# Patient Record
Sex: Female | Born: 1990 | Race: Black or African American | Hispanic: No | Smoking: Never smoker
Health system: Southern US, Community
[De-identification: ages and names within clinical notes are randomized; demographics above are authoritative.]

## PROBLEM LIST (undated history)

## (undated) DIAGNOSIS — F32A Depression, unspecified: Secondary | ICD-10-CM

## (undated) DIAGNOSIS — D571 Sickle-cell disease without crisis: Secondary | ICD-10-CM

## (undated) DIAGNOSIS — F4310 Post-traumatic stress disorder, unspecified: Secondary | ICD-10-CM

## (undated) DIAGNOSIS — R569 Unspecified convulsions: Secondary | ICD-10-CM

## (undated) DIAGNOSIS — F319 Bipolar disorder, unspecified: Secondary | ICD-10-CM

## (undated) DIAGNOSIS — F329 Major depressive disorder, single episode, unspecified: Secondary | ICD-10-CM

## (undated) HISTORY — DX: Sickle-cell disease without crisis: D57.1

## (undated) HISTORY — DX: Depression, unspecified: F32.A

---
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
## 2019-06-30 ENCOUNTER — Other Ambulatory Visit: Payer: Self-pay

## 2019-06-30 ENCOUNTER — Emergency Department (HOSPITAL_COMMUNITY)
Admission: EM | Admit: 2019-06-30 | Discharge: 2019-06-30 | Disposition: A | Discharge status: Home / Self Care | Payer: Medicaid - Out of State | Attending: Emergency Medicine | Admitting: Emergency Medicine

## 2019-06-30 ENCOUNTER — Encounter (HOSPITAL_COMMUNITY): Payer: Self-pay

## 2019-06-30 DIAGNOSIS — R569 Unspecified convulsions: Secondary | ICD-10-CM | POA: Diagnosis not present

## 2019-06-30 DIAGNOSIS — Z79899 Other long term (current) drug therapy: Secondary | ICD-10-CM | POA: Diagnosis not present

## 2019-06-30 HISTORY — DX: Unspecified convulsions: R56.9

## 2019-06-30 LAB — RAPID URINE DRUG SCREEN, HOSP PERFORMED
Amphetamines: NOT DETECTED
Barbiturates: NOT DETECTED
Benzodiazepines: NOT DETECTED
Cocaine: NOT DETECTED
Opiates: NOT DETECTED
Tetrahydrocannabinol: NOT DETECTED

## 2019-06-30 LAB — URINALYSIS, ROUTINE W REFLEX MICROSCOPIC
Bilirubin Urine: NEGATIVE
Glucose, UA: NEGATIVE mg/dL
Ketones, ur: NEGATIVE mg/dL
Nitrite: NEGATIVE
Protein, ur: 100 mg/dL — AB
RBC / HPF: >50 RBC/hpf — ABNORMAL HIGH (ref 0–5)
Specific Gravity, Urine: 1.013 (ref 1.005–1.030)
pH: 6 (ref 5.0–8.0)

## 2019-06-30 LAB — COMPREHENSIVE METABOLIC PANEL
ALT: 24 U/L (ref 0–44)
AST: 22 U/L (ref 15–41)
Albumin: 4.1 g/dL (ref 3.5–5.0)
Alkaline Phosphatase: 64 U/L (ref 38–126)
Anion gap: 8 (ref 5–15)
BUN: 10 mg/dL (ref 6–20)
CO2: 25 mmol/L (ref 22–32)
Calcium: 9.5 mg/dL (ref 8.9–10.3)
Chloride: 104 mmol/L (ref 98–111)
Creatinine, Ser: 0.85 mg/dL (ref 0.44–1.00)
GFR calc Af Amer: >60 mL/min (ref >60)
GFR calc non Af Amer: >60 mL/min (ref >60)
Glucose, Bld: 100 mg/dL — ABNORMAL HIGH (ref 70–99)
Potassium: 4.4 mmol/L (ref 3.5–5.1)
Sodium: 137 mmol/L (ref 135–145)
Total Bilirubin: 0.3 mg/dL (ref 0.3–1.2)
Total Protein: 8.3 g/dL — ABNORMAL HIGH (ref 6.5–8.1)

## 2019-06-30 LAB — CBC WITH DIFFERENTIAL/PLATELET
Abs Immature Granulocytes: 0.02 10*3/uL (ref 0.00–0.07)
Basophils Absolute: 0 10*3/uL (ref 0.0–0.1)
Basophils Relative: 1 %
Eosinophils Absolute: 0.1 10*3/uL (ref 0.0–0.5)
Eosinophils Relative: 2 %
HCT: 33.2 % — ABNORMAL LOW (ref 36.0–46.0)
Hemoglobin: 10.6 g/dL — ABNORMAL LOW (ref 12.0–15.0)
Immature Granulocytes: 0 %
Lymphocytes Relative: 22 %
Lymphs Abs: 1.3 10*3/uL (ref 0.7–4.0)
MCH: 25.2 pg — ABNORMAL LOW (ref 26.0–34.0)
MCHC: 31.9 g/dL (ref 30.0–36.0)
MCV: 78.9 fL — ABNORMAL LOW (ref 80.0–100.0)
Monocytes Absolute: 0.3 10*3/uL (ref 0.1–1.0)
Monocytes Relative: 5 %
Neutro Abs: 4.2 10*3/uL (ref 1.7–7.7)
Neutrophils Relative %: 70 %
Platelets: 413 10*3/uL — ABNORMAL HIGH (ref 150–400)
RBC: 4.21 MIL/uL (ref 3.87–5.11)
RDW: 14.8 % (ref 11.5–15.5)
WBC: 6 10*3/uL (ref 4.0–10.5)
nRBC: 0 % (ref 0.0–0.2)

## 2019-06-30 LAB — CBG MONITORING, ED: Glucose-Capillary: 80 mg/dL (ref 70–99)

## 2019-06-30 LAB — MAGNESIUM: Magnesium: 2.2 mg/dL (ref 1.7–2.4)

## 2019-06-30 LAB — PHOSPHORUS: Phosphorus: 3.1 mg/dL (ref 2.5–4.6)

## 2019-06-30 LAB — ETHANOL: Alcohol, Ethyl (B): <10 mg/dL (ref <10)

## 2019-06-30 LAB — POC URINE PREG, ED: Preg Test, Ur: NEGATIVE

## 2019-06-30 MED ORDER — LEVETIRACETAM 500 MG PO TABS: 500.0000 mg | ORAL_TABLET | Freq: Two times a day (BID) | ORAL | 0 refills | Status: DC

## 2019-06-30 MED ORDER — LEVETIRACETAM IN NACL 1000 MG/100ML IV SOLN
1000.0000 mg | Freq: Once | INTRAVENOUS | Status: AC
  Administered 2019-06-30: 21:00:00 1000 mg via INTRAVENOUS
  Filled 2019-06-30: qty 100

## 2019-06-30 NOTE — ED Provider Notes (Signed)
[facility] EMERGENCY DEPARTMENT Provider Note   CSN: [id number] Arrival date & time: 06/30/19  2004     History   Chief Complaint Chief Complaint  Patient presents with  . Seizures    HPI Jessica Hobbs is a 28 y.o. female.  Who presents emergency department after seizure.  The patient has a history of epilepsy.  Her last seizure was on 17 June 2019.  She recently moved here from [place] and states that her medications got lost during the move.  She normally takes Keppra and Lamictal.  She has not taken her medications for several weeks.  She is unaware when she is about to have a seizure.  She did have some tongue biting today.  Patient had 2 witnessed seizures tonight.  According the patient she gave birth Apr [birth year], 2019 not 2 weeks ago.  She denies fevers, chills.  She denies drug drug ingestion or alcohol abuse.     HPI  Past Medical History:  Diagnosis Date  . Seizures ([place])     There are no active problems to display for this patient.   History reviewed. No pertinent surgical history.   OB History   No obstetric history on file.      Home Medications    Prior to Admission medications   Not on File    Family History No family history on file.  Social History Social History   Tobacco Use  . Smoking status: Never Smoker  . Smokeless tobacco: Never Used  Substance Use Topics  . Alcohol use: Never    Frequency: Never  . Drug use: Never     Allergies   Soy allergy   Review of Systems Review of Systems Ten systems reviewed and are negative for acute change, except as noted in the HPI.    Physical Exam Updated Vital Signs BP 128/66 (BP Location: Right Arm)   Pulse (!) 102   Temp 98.8 F (37.1 C) (Oral)   Resp 15   Ht 5\' 5"  (1.651 m)   Wt 114.8 kg   SpO2 97%   BMI 42.10 kg/m   Physical Exam Vitals signs and nursing note reviewed.  Constitutional:      General: She is not in acute distress.    Appearance: She is well-developed.  She is not diaphoretic.  HENT:     Head: Normocephalic and atraumatic.     Mouth/Throat:     Comments: Small bite marks to both sides of the tongue Eyes:     General: No scleral icterus.    Conjunctiva/sclera: Conjunctivae normal.  Neck:     Musculoskeletal: Normal range of motion.  Cardiovascular:     Rate and Rhythm: Normal rate and regular rhythm.     Heart sounds: Normal heart sounds. No murmur. No friction rub. No gallop.   Pulmonary:     Effort: Pulmonary effort is normal. No respiratory distress.     Breath sounds: Normal breath sounds.  Abdominal:     General: Bowel sounds are normal. There is no distension.     Palpations: Abdomen is soft. There is no mass.     Tenderness: There is no abdominal tenderness. There is no guarding.  Skin:    General: Skin is warm and dry.  Neurological:     Mental Status: She is alert and oriented to person, place, and time.  Psychiatric:        Behavior: Behavior normal.      ED Treatments / Results  Labs (  all labs ordered are listed, but only abnormal results are displayed) Labs Reviewed  CBC WITH DIFFERENTIAL/PLATELET - Abnormal; Notable for the following components:      Result Value   Hemoglobin 10.6 (*)    HCT 33.2 (*)    MCV 78.9 (*)    MCH 25.2 (*)    Platelets 413 (*)    All other components within normal limits  COMPREHENSIVE METABOLIC PANEL - Abnormal; Notable for the following components:   Glucose, Bld 100 (*)    Total Protein 8.3 (*)    All other components within normal limits  MAGNESIUM  PHOSPHORUS  ETHANOL  RAPID URINE DRUG SCREEN, HOSP PERFORMED  URINALYSIS, ROUTINE W REFLEX MICROSCOPIC  CBG MONITORING, ED  POC URINE PREG, ED    EKG None  Radiology No results found.  Procedures Procedures (including critical care time)  Medications Ordered in ED Medications - No data to display   Initial Impression / Assessment and Plan / ED Course  I have reviewed the triage vital signs and the nursing  notes.  Pertinent labs & imaging results that were available during my care of the patient were reviewed by me and considered in my medical decision making (see chart for details).       28 year old female with history of epilepsy.  She has not been taking her seizure medications.  She had 2 witnessed seizures today.  The patient has not had a seizure here in the emergency department.  I personally reviewed the patient's labs which shows negative pregnancy test, negative UDS, normal blood sugar.  She has some microcytic anemia.  I do not have comparative labs but I doubt significant blood loss.  She has stable vital signs.  I reviewed the patient's CMP which shows mildly elevated blood glucose, normal mag, 5 phosphorus and ethanol levels.  Patient's urine appears contaminated.  She denies any current urinary symptoms.   EKG Interpretation  Date/Time:  Thursday June 30 2019 20:10:20 EDT Ventricular Rate:  100 PR Interval:    QRS Duration: 102 QT Interval:  361 QTC Calculation: 466 R Axis:   67 Text Interpretation:  Sinus tachycardia no prior available for comparison Confirmed by Tilden Fossaees, Elizabeth [phone]([id number]) on 07/01/2019 11:07:16 AM       Patient was given a loading dose of Keppra here.  She be placed back on Keppra 500 twice daily.  She should follow-up with her neurologist in [place] tomorrow to have them re-prescribe her medications.  She appears appropriate for discharge at this time discussed return precautions.  Final Clinical Impressions(s) / ED Diagnoses   Final diagnoses:  None    ED Discharge Orders    None       Arthor CaptainHarris, Nachum Derossett, PA-C 07/02/19 1327    Bethann BerkshireZammit, Joseph, MD 07/05/19 [phone]

## 2019-06-30 NOTE — ED Triage Notes (Signed)
Pt gets medical care in [place], states had 2 witnessed seizures tonight.  Pt is 2 weeks post-partum.  Pt denies any issues during pregnancy.  Pt states she takes lamictal and another seizure med but does not take it like she is supposed to.  Pt denies pain other than to where she bit her tongue. Pt is awake and alert.

## 2019-06-30 NOTE — Discharge Instructions (Signed)
You need to follow closely with your neurologist in [place].  Please call them to get refills on your medication that you lost during your move.  I am giving you a prescription for the standard dose of Keppra however I am unable to fill your Lamictal prescription because I do not know what you normally take.  Please avoid any seizure threshold lowering medications like tramadol and avoid alcohol.  Make sure you are getting plenty of sleep.  Department of Motor Vehicle [facility]) of [place] regulations for seizures - It is the patients responsibility to report the incidence of the seizure in the state of [place]. [place] has no statutory provision requiring physicians to report patients diagnosed with epilepsy or seizures to a central state agency.  The recommended DMV regulation requirement for a driver in [place] for an individual with a seizure is that they be seizure-free for 6-12 months. However, the DMV may consider the following exceptions to this general rule where: (1) a physician-directed change in medication causes a seizure and the individual immediately resumes the previous therapy which controlled seizures; (2) there is a history of nocturnal seizures or seizures which do not involve loss of consciousness, loss of control of motor function, or loss of appropriate sensation and information process; and (3) an individual has a seizure disorder preceded by an aura (warning) lasting 2-3 minutes. While the [facility] may also give consideration to other unusual circumstances which may affect the general requirement that drivers be seizure-free for 6-12 months, interpretation of these circumstances and assignment of restrictions is at the discretion of the Medical Advisor. The DMV also considers compliance with medical therapy essential for safe driving. [facility] Physician's Guide to [employer] (June, 1995 ed.)] The Department learns of an individual's condition by inquiring on the application  form or renewal form, a physician's report to the [facility], an accident report or from correspondence from the individual. The person may be required to submit a Medical Report Form either annually or semi-annually.

## 2019-07-29 ENCOUNTER — Emergency Department (HOSPITAL_COMMUNITY)
Admission: EM | Admit: 2019-07-29 | Discharge: 2019-07-29 | Disposition: A | Discharge status: Home / Self Care | Payer: Medicaid - Out of State | Attending: Emergency Medicine | Admitting: Emergency Medicine

## 2019-07-29 ENCOUNTER — Other Ambulatory Visit: Payer: Self-pay

## 2019-07-29 ENCOUNTER — Encounter (HOSPITAL_COMMUNITY): Payer: Self-pay | Admitting: Emergency Medicine

## 2019-07-29 DIAGNOSIS — Z79899 Other long term (current) drug therapy: Secondary | ICD-10-CM | POA: Diagnosis not present

## 2019-07-29 DIAGNOSIS — R569 Unspecified convulsions: Secondary | ICD-10-CM | POA: Diagnosis present

## 2019-07-29 LAB — CBC WITH DIFFERENTIAL/PLATELET
Abs Immature Granulocytes: 0.03 10*3/uL (ref 0.00–0.07)
Basophils Absolute: 0.1 10*3/uL (ref 0.0–0.1)
Basophils Relative: 1 %
Eosinophils Absolute: 0.1 10*3/uL (ref 0.0–0.5)
Eosinophils Relative: 1 %
HCT: 32.5 % — ABNORMAL LOW (ref 36.0–46.0)
Hemoglobin: 10.3 g/dL — ABNORMAL LOW (ref 12.0–15.0)
Immature Granulocytes: 0 %
Lymphocytes Relative: 23 %
Lymphs Abs: 1.8 10*3/uL (ref 0.7–4.0)
MCH: 25.1 pg — ABNORMAL LOW (ref 26.0–34.0)
MCHC: 31.7 g/dL (ref 30.0–36.0)
MCV: 79.3 fL — ABNORMAL LOW (ref 80.0–100.0)
Monocytes Absolute: 0.4 10*3/uL (ref 0.1–1.0)
Monocytes Relative: 5 %
Neutro Abs: 5.5 10*3/uL (ref 1.7–7.7)
Neutrophils Relative %: 70 %
Platelets: 414 10*3/uL — ABNORMAL HIGH (ref 150–400)
RBC: 4.1 MIL/uL (ref 3.87–5.11)
RDW: 15 % (ref 11.5–15.5)
WBC: 7.9 10*3/uL (ref 4.0–10.5)
nRBC: 0 % (ref 0.0–0.2)

## 2019-07-29 LAB — COMPREHENSIVE METABOLIC PANEL
ALT: 20 U/L (ref 0–44)
AST: 22 U/L (ref 15–41)
Albumin: 3.7 g/dL (ref 3.5–5.0)
Alkaline Phosphatase: 54 U/L (ref 38–126)
Anion gap: 11 (ref 5–15)
BUN: 11 mg/dL (ref 6–20)
CO2: 21 mmol/L — ABNORMAL LOW (ref 22–32)
Calcium: 9.1 mg/dL (ref 8.9–10.3)
Chloride: 104 mmol/L (ref 98–111)
Creatinine, Ser: 0.88 mg/dL (ref 0.44–1.00)
GFR calc Af Amer: >60 mL/min (ref >60)
GFR calc non Af Amer: >60 mL/min (ref >60)
Glucose, Bld: 118 mg/dL — ABNORMAL HIGH (ref 70–99)
Potassium: 3.4 mmol/L — ABNORMAL LOW (ref 3.5–5.1)
Sodium: 136 mmol/L (ref 135–145)
Total Bilirubin: 0.2 mg/dL — ABNORMAL LOW (ref 0.3–1.2)
Total Protein: 7.7 g/dL (ref 6.5–8.1)

## 2019-07-29 LAB — URINALYSIS, ROUTINE W REFLEX MICROSCOPIC
Bacteria, UA: NONE SEEN
Bilirubin Urine: NEGATIVE
Glucose, UA: NEGATIVE mg/dL
Ketones, ur: NEGATIVE mg/dL
Nitrite: NEGATIVE
Protein, ur: NEGATIVE mg/dL
Specific Gravity, Urine: 1.005 (ref 1.005–1.030)
pH: 5 (ref 5.0–8.0)

## 2019-07-29 LAB — MAGNESIUM: Magnesium: 2 mg/dL (ref 1.7–2.4)

## 2019-07-29 LAB — CBG MONITORING, ED: Glucose-Capillary: 96 mg/dL (ref 70–99)

## 2019-07-29 LAB — PREGNANCY, URINE: Preg Test, Ur: NEGATIVE

## 2019-07-29 MED ORDER — LEVETIRACETAM IN NACL 1000 MG/100ML IV SOLN
1000.0000 mg | Freq: Once | INTRAVENOUS | Status: AC
  Administered 2019-07-29: 1000 mg via INTRAVENOUS
  Filled 2019-07-29: qty 100

## 2019-07-29 MED ORDER — LAMOTRIGINE 25 MG PO TABS
100.0000 mg | ORAL_TABLET | Freq: Once | ORAL | Status: AC
  Administered 2019-07-29: 100 mg via ORAL
  Filled 2019-07-29: qty 4

## 2019-07-29 MED ORDER — LEVETIRACETAM 500 MG PO TABS: 500.0000 mg | ORAL_TABLET | Freq: Two times a day (BID) | ORAL | 0 refills | Status: DC

## 2019-07-29 MED ORDER — LAMOTRIGINE 100 MG PO TABS: ORAL_TABLET | ORAL | 0 refills | Status: DC

## 2019-07-29 NOTE — ED Triage Notes (Signed)
Per EMS patient had witnessed seizure at home. Patient postictal upon EMS arrival. Patient alert x 4 and ambulatory in triage. Patient states she does not take her anit-seizure medication regularly.

## 2019-07-29 NOTE — ED Provider Notes (Signed)
[facility] EMERGENCY DEPARTMENT Provider Note   CSN: [id number] Arrival date & time: 07/29/19  0840     History   Chief Complaint Chief Complaint  Patient presents with  . Seizures    HPI Jessica Hobbs is a 28 y.o. female.     Patient is a 28 year old female who presents to the emergency department by EMS following a witnessed seizure at home.  The patient states that she has a history of seizures.  She has been prescribed medication.  She says however that she misses doses from time to time.  She did take a dose of her Lamictal as well as her Keppra on yesterday, but she says she probably missed 2 or 3 days of taking the medication prior to that.  Patient denies any recent fever, cough, chills, or known infection.  She has not had any recent injury or trauma to the head or neck.  She is not had any changes in medication, she is only been inconsistent with taking her medications.  Patient does not remember the seizure, but does remember the EMS personnel taking her to the emergency department.  The history is provided by the patient.  Seizures   Past Medical History:  Diagnosis Date  . Seizures ([facility])     There are no active problems to display for this patient.   Past Surgical History:  Procedure Laterality Date  . CESAREAN SECTION       OB History   No obstetric history on file.      Home Medications    Prior to Admission medications   Medication Sig Start Date End Date Taking? Authorizing Provider  levETIRAcetam (KEPPRA) 500 MG tablet Take 1 tablet (500 mg total) by mouth 2 (two) times daily. 06/30/19   Arthor CaptainHarris, Abigail, PA-C    Family History No family history on file.  Social History Social History   Tobacco Use  . Smoking status: Never Smoker  . Smokeless tobacco: Never Used  Substance Use Topics  . Alcohol use: Never    Frequency: Never  . Drug use: Never     Allergies   Soy allergy   Review of Systems Review of Systems  Constitutional:  Negative for activity change and appetite change.  HENT: Negative for congestion, ear discharge, ear pain, facial swelling, nosebleeds, rhinorrhea, sneezing and tinnitus.   Eyes: Negative for photophobia, pain and discharge.  Respiratory: Negative for cough, choking, shortness of breath and wheezing.   Cardiovascular: Negative for chest pain, palpitations and leg swelling.  Gastrointestinal: Negative for abdominal pain, blood in stool, constipation, diarrhea, nausea and vomiting.  Genitourinary: Negative for difficulty urinating, dysuria, flank pain, frequency and hematuria.  Musculoskeletal: Negative for back pain, gait problem, myalgias and neck pain.  Skin: Negative for color change, rash and wound.  Neurological: Positive for seizures. Negative for dizziness, syncope, facial asymmetry, speech difficulty, weakness and numbness.  Hematological: Negative for adenopathy. Does not bruise/bleed easily.  Psychiatric/Behavioral: Negative for agitation, confusion, hallucinations, self-injury and suicidal ideas. The patient is not nervous/anxious.      Physical Exam Updated Vital Signs BP 137/83 (BP Location: Left Arm)   Pulse (!) 104   Temp 98.6 F (37 C) (Oral)   Resp 18   Ht 5\' 5"  (1.651 m)   Wt 108.9 kg   LMP 07/24/2019 (Approximate)   SpO2 97%   BMI 39.94 kg/m   Physical Exam Vitals signs and nursing note reviewed. Exam conducted with a chaperone present.  Constitutional:  General: She is not in acute distress.    Appearance: She is well-developed.  HENT:     Head: Normocephalic and atraumatic.     Comments: No noted bruising or trauma of the face and head.    Right Ear: External ear normal.     Left Ear: External ear normal.     Mouth/Throat:     Comments: There are bites to the right and left side of the tongue. Shallow abrasion of the mucosa of the lower lip. Eyes:     General: No scleral icterus.       Right eye: No discharge.        Left eye: No discharge.      Conjunctiva/sclera: Conjunctivae normal.  Neck:     Musculoskeletal: Neck supple.     Trachea: No tracheal deviation.  Cardiovascular:     Rate and Rhythm: Normal rate and regular rhythm.  Pulmonary:     Effort: Pulmonary effort is normal. No respiratory distress.     Breath sounds: Normal breath sounds. No stridor. No wheezing or rales.  Chest:     Comments: No pain of the chest or ribs.  Chaperone present during the examination.  There is symmetrical rise and fall of the chest. Abdominal:     General: Bowel sounds are normal. There is no distension.     Palpations: Abdomen is soft.     Tenderness: There is no abdominal tenderness. There is no guarding or rebound.  Musculoskeletal:        General: No tenderness.     Comments: There is full range of motion of upper and lower extremities.  No noted bruising appreciated.  Skin:    General: Skin is warm and dry.     Findings: No rash.  Neurological:     Mental Status: She is alert and oriented to person, place, and time.     Cranial Nerves: No cranial nerve deficit (no facial droop, extraocular movements intact, no slurred speech).     Sensory: No sensory deficit.     Motor: No abnormal muscle tone or seizure activity.     Coordination: Coordination normal.  Psychiatric:        Mood and Affect: Mood normal.      ED Treatments / Results  Labs (all labs ordered are listed, but only abnormal results are displayed) Labs Reviewed  COMPREHENSIVE METABOLIC PANEL  CBC WITH DIFFERENTIAL/PLATELET  PREGNANCY, URINE  URINALYSIS, ROUTINE W REFLEX MICROSCOPIC  MAGNESIUM  LEVETIRACETAM LEVEL  LAMOTRIGINE LEVEL  CBG MONITORING, ED    EKG EKG Interpretation  Date/Time:  Friday July 29 2019 08:46:30 EDT Ventricular Rate:  106 PR Interval:    QRS Duration: 107 QT Interval:  355 QTC Calculation: 472 R Axis:   63 Text Interpretation:  Sinus tachycardia Borderline Q waves in inferior leads When compared with ECG of 06/30/2019 No  significant change was found Confirmed by Samuel Jester [phone]) on 07/29/2019 9:18:34 AM   Radiology No results found.  Procedures Procedures (including critical care time)  Medications Ordered in ED Medications  levETIRAcetam (KEPPRA) IVPB 1000 mg/100 mL premix (has no administration in time range)     Initial Impression / Assessment and Plan / ED Course  I have reviewed the triage vital signs and the nursing notes.  Pertinent labs & imaging results that were available during my care of the patient were reviewed by me and considered in my medical decision making (see chart for details).  Final Clinical Impressions(s) / ED Diagnoses MDM  Vital signs reviewed.  Pulse oximetry is 96% on room air.  Within normal limits by my interpretation.  Capillary blood glucose is 96.  There is no evidence of major bruising or trauma as result of the seizure.  Patient started with IV Keppra.  Patient is unsure of how much of the Lamictal that she takes.  Recheck: no seizure activity reported or noted at this time.  Recheck reveals no acute neurovascular changes.  Recheck.  Patient tolerating IV Keppra without problem.  No additional seizure activity.  Patient sleeping, but easily aroused.  Patient ambulatory to the bathroom without problem.  Comprehensive metabolic panel shows a potassium be slightly low at 3.4, the CO2 slightly low at 21.  The anion gap is normal at 11.  The complete blood count shows the hemoglobin to be slightly low at 10.3, the hematocrit slightly low at 32.5.  Platelets are elevated at 414,000.  Magnesium is normal at 2.0. Urine analysis shows a hazy specimen with a specific gravity of 1.005.  There is a large amount of blood on the dipstick.  The nitrates are negative.  There is a trace leukocyte esterase present.  0-5 red blood cells, 0-5 white blood cells, and no bacteria seen.  A Keppra level and Lamictal level were sent to the lab.  Patient is awake  and alert.  She is ambulatory without problem.  There is been no recurrence of seizure activity.  Prescription for Keppra and Lamictal given to the patient.  Patient given information on the [facility] clinic to assist the patient in obtaining a primary physician and someone to help follow her medications and monitor her seizure activity.  Patient is in agreement with this plan.   Final diagnoses:  Seizure [facility])    ED Discharge Orders         Ordered    lamoTRIgine (LAMICTAL) 100 MG tablet     07/29/19 1248    levETIRAcetam (KEPPRA) 500 MG tablet  2 times daily     07/29/19 1248           Lily Kocher, PA-C 07/30/19 2031    Francine Graven, DO 08/01/19 1718

## 2019-07-29 NOTE — Discharge Instructions (Addendum)
Your lab work is negative for any acute abnormalities.  Your urine test is negative for urinary tract infection.  You have been given IV Keppra to increase your level and to hopefully decrease your seizure threshold.  You have also been given oral Lamictal.  A prescription for both Keppra and Lamictal have been provided on.  It is extremely important that you take your medications regularly and on time to prevent further seizure activity and other acute problems.  Please call the [facility] clinic to establish a primary physician in this area, and help you with control of your seizures.

## 2019-08-02 LAB — LEVETIRACETAM LEVEL: Levetiracetam Lvl: 17.3 ug/mL (ref 10.0–40.0)

## 2019-08-02 LAB — LAMOTRIGINE LEVEL: Lamotrigine Lvl: <1 ug/mL — ABNORMAL LOW (ref 2.0–20.0)

## 2019-09-05 ENCOUNTER — Emergency Department (HOSPITAL_COMMUNITY)
Admission: EM | Admit: 2019-09-05 | Discharge: 2019-09-06 | Disposition: A | Discharge status: Home / Self Care | Payer: Medicaid - Out of State | Attending: Emergency Medicine | Admitting: Emergency Medicine

## 2019-09-05 ENCOUNTER — Encounter (HOSPITAL_COMMUNITY): Payer: Self-pay | Admitting: *Deleted

## 2019-09-05 ENCOUNTER — Other Ambulatory Visit: Payer: Self-pay

## 2019-09-05 DIAGNOSIS — R569 Unspecified convulsions: Secondary | ICD-10-CM | POA: Diagnosis present

## 2019-09-05 DIAGNOSIS — Z79899 Other long term (current) drug therapy: Secondary | ICD-10-CM | POA: Diagnosis not present

## 2019-09-05 DIAGNOSIS — G40909 Epilepsy, unspecified, not intractable, without status epilepticus: Secondary | ICD-10-CM | POA: Insufficient documentation

## 2019-09-05 HISTORY — DX: Post-traumatic stress disorder, unspecified: F43.10

## 2019-09-05 HISTORY — DX: Bipolar disorder, unspecified: F31.9

## 2019-09-05 LAB — COMPREHENSIVE METABOLIC PANEL
ALT: 19 U/L (ref 0–44)
AST: 18 U/L (ref 15–41)
Albumin: 3.9 g/dL (ref 3.5–5.0)
Alkaline Phosphatase: 57 U/L (ref 38–126)
Anion gap: 10 (ref 5–15)
BUN: 10 mg/dL (ref 6–20)
CO2: 24 mmol/L (ref 22–32)
Calcium: 9.3 mg/dL (ref 8.9–10.3)
Chloride: 103 mmol/L (ref 98–111)
Creatinine, Ser: 1.12 mg/dL — ABNORMAL HIGH (ref 0.44–1.00)
GFR calc Af Amer: >60 mL/min (ref >60)
GFR calc non Af Amer: >60 mL/min (ref >60)
Glucose, Bld: 115 mg/dL — ABNORMAL HIGH (ref 70–99)
Potassium: 4.3 mmol/L (ref 3.5–5.1)
Sodium: 137 mmol/L (ref 135–145)
Total Bilirubin: 0.1 mg/dL — ABNORMAL LOW (ref 0.3–1.2)
Total Protein: 8 g/dL (ref 6.5–8.1)

## 2019-09-05 LAB — CBC WITH DIFFERENTIAL/PLATELET
Abs Immature Granulocytes: 0.02 10*3/uL (ref 0.00–0.07)
Basophils Absolute: 0 10*3/uL (ref 0.0–0.1)
Basophils Relative: 0 %
Eosinophils Absolute: 0 10*3/uL (ref 0.0–0.5)
Eosinophils Relative: 0 %
HCT: 34.4 % — ABNORMAL LOW (ref 36.0–46.0)
Hemoglobin: 11 g/dL — ABNORMAL LOW (ref 12.0–15.0)
Immature Granulocytes: 0 %
Lymphocytes Relative: 18 %
Lymphs Abs: 1.4 10*3/uL (ref 0.7–4.0)
MCH: 25.4 pg — ABNORMAL LOW (ref 26.0–34.0)
MCHC: 32 g/dL (ref 30.0–36.0)
MCV: 79.4 fL — ABNORMAL LOW (ref 80.0–100.0)
Monocytes Absolute: 0.5 10*3/uL (ref 0.1–1.0)
Monocytes Relative: 6 %
Neutro Abs: 5.7 10*3/uL (ref 1.7–7.7)
Neutrophils Relative %: 76 %
Platelets: 380 10*3/uL (ref 150–400)
RBC: 4.33 MIL/uL (ref 3.87–5.11)
RDW: 15 % (ref 11.5–15.5)
WBC: 7.6 10*3/uL (ref 4.0–10.5)
nRBC: 0 % (ref 0.0–0.2)

## 2019-09-05 LAB — I-STAT BETA HCG BLOOD, ED (MC, WL, AP ONLY): I-stat hCG, quantitative: <5 m[IU]/mL (ref <5)

## 2019-09-05 MED ORDER — LEVETIRACETAM IN NACL 1000 MG/100ML IV SOLN
1000.0000 mg | Freq: Once | INTRAVENOUS | Status: AC
  Administered 2019-09-05: 23:00:00 1000 mg via INTRAVENOUS
  Filled 2019-09-05: qty 100

## 2019-09-05 MED ORDER — SODIUM CHLORIDE 0.9 % IV BOLUS
1000.0000 mL | Freq: Once | INTRAVENOUS | Status: AC
  Administered 2019-09-05: 23:00:00 1000 mL via INTRAVENOUS

## 2019-09-05 NOTE — ED Provider Notes (Signed)
[facility] EMERGENCY DEPARTMENT Provider Note   CSN: [id number] Arrival date & time: 09/05/19  2211     History   Chief Complaint Chief Complaint  Patient presents with  . Seizures    HPI Jessica Hobbs is a 28 y.o. female.     Patient here after witnessed seizure activity.  History of seizures for which she takes Keppra as well as Lamictal.  EMS reports with witnessed seizure that was tonic-clonic at her aunt's house.  She arrived postictal but is now awake and alert.  There is no known head trauma.  There was some tongue and lip biting.  No incontinence.  No fevers, chills, nausea or vomiting.  No recent illnesses.  No head, neck, back pain.  Patient admits to missing her seizure medications last night.  She states she has been compliant with her medications otherwise of Keppra twice a day Lamictal twice a day.  She has a neurologist in [place].  She reports she is due for MRI later this week.  She admits to missing her medications some days but states she takes them when she remembers but did not take them yesterday.  Denies any possibility of pregnancy.  The history is provided by the patient and the EMS personnel.  Seizures   Past Medical History:  Diagnosis Date  . Bipolar 1 disorder ([place])   . PTSD (post-traumatic stress disorder)   . Seizures ([place])     There are no active problems to display for this patient.   Past Surgical History:  Procedure Laterality Date  . CESAREAN SECTION       OB History   No obstetric history on file.      Home Medications    Prior to Admission medications   Medication Sig Start Date End Date Taking? Authorizing Provider  lamoTRIgine (LAMICTAL) 100 MG tablet Take 1 tab each morning, and 2 at hs 07/29/19   Lily Kocher, PA-C  levETIRAcetam (KEPPRA) 500 MG tablet Take 1 tablet (500 mg total) by mouth 2 (two) times daily. 07/29/19   Lily Kocher, PA-C    Family History History reviewed. No pertinent family history.  Social  History Social History   Tobacco Use  . Smoking status: Never Smoker  . Smokeless tobacco: Never Used  Substance Use Topics  . Alcohol use: Never    Frequency: Never  . Drug use: Never     Allergies   Soy allergy   Review of Systems Review of Systems  Constitutional: Negative for activity change, appetite change and fever.  HENT: Negative for congestion and rhinorrhea.   Eyes: Negative for visual disturbance.  Respiratory: Negative for cough, chest tightness and shortness of breath.   Cardiovascular: Negative for chest pain.  Gastrointestinal: Negative for abdominal pain, nausea and vomiting.  Genitourinary: Negative for dysuria and hematuria.  Musculoskeletal: Negative for arthralgias and myalgias.  Skin: Positive for wound.  Neurological: Positive for seizures. Negative for dizziness and headaches.   all other systems are negative except as noted in the HPI and PMH.     Physical Exam Updated Vital Signs BP 119/73 (BP Location: Right Arm)   Pulse (!) 104   Temp 98.3 F (36.8 C) (Oral)   Ht 5\' 5"  (1.651 m)   Wt 108 kg   SpO2 99%   BMI 39.62 kg/m   Physical Exam Vitals signs and nursing note reviewed.  Constitutional:      General: She is not in acute distress.    Appearance: She is well-developed.  She is obese.  HENT:     Head: Normocephalic and atraumatic.     Mouth/Throat:     Pharynx: No oropharyngeal exudate.     Comments: Lower lip abrasion Eyes:     Extraocular Movements: Extraocular movements intact.     Conjunctiva/sclera: Conjunctivae normal.     Pupils: Pupils are equal, round, and reactive to light.     Comments: Dysconjugate gaze, baseline per patient.   Neck:     Musculoskeletal: Normal range of motion and neck supple.     Comments: No meningismus. Cardiovascular:     Rate and Rhythm: Normal rate and regular rhythm.     Heart sounds: Normal heart sounds. No murmur.  Pulmonary:     Effort: Pulmonary effort is normal. No respiratory  distress.     Breath sounds: Normal breath sounds.  Abdominal:     Palpations: Abdomen is soft.     Tenderness: There is no abdominal tenderness. There is no guarding or rebound.  Musculoskeletal: Normal range of motion.        General: No tenderness.  Skin:    General: Skin is warm.     Capillary Refill: Capillary refill takes less than 2 seconds.  Neurological:     General: No focal deficit present.     Mental Status: She is alert and oriented to person, place, and time. Mental status is at baseline.     Cranial Nerves: No cranial nerve deficit.     Motor: No abnormal muscle tone.     Coordination: Coordination normal.     Comments: No ataxia on finger to nose bilaterally. No pronator drift. 5/5 strength throughout. CN 2-12 intact.Equal grip strength. Sensation intact.   Psychiatric:        Behavior: Behavior normal.      ED Treatments / Results  Labs (all labs ordered are listed, but only abnormal results are displayed) Labs Reviewed  CBC WITH DIFFERENTIAL/PLATELET - Abnormal; Notable for the following components:      Result Value   Hemoglobin 11.0 (*)    HCT 34.4 (*)    MCV 79.4 (*)    MCH 25.4 (*)    All other components within normal limits  COMPREHENSIVE METABOLIC PANEL - Abnormal; Notable for the following components:   Glucose, Bld 115 (*)    Creatinine, Ser 1.12 (*)    Total Bilirubin 0.1 (*)    All other components within normal limits  RAPID URINE DRUG SCREEN, HOSP PERFORMED  I-STAT BETA HCG BLOOD, ED ([facility], [facility], [facility] ONLY)    EKG EKG Interpretation  Date/Time:  Monday September 05 2019 22:47:48 EDT Ventricular Rate:  100 PR Interval:    QRS Duration: 109 QT Interval:  368 QTC Calculation: 475 R Axis:   68 Text Interpretation:  Sinus tachycardia RSR' in V1 or V2, right VCD or RVH No significant change was found Confirmed by Glynn Octave [phone]) on 09/05/2019 11:19:19 PM   Radiology Ct Head Wo Contrast  Result Date: 09/06/2019 CLINICAL DATA:   Encephalopathy, witnessed seizure, history of epilepsy EXAM: CT HEAD WITHOUT CONTRAST TECHNIQUE: Contiguous axial images were obtained from the base of the skull through the vertex without intravenous contrast. COMPARISON:  None. FINDINGS: Brain: No evidence of acute infarction, hemorrhage, hydrocephalus, extra-axial collection or mass lesion/mass effect. Morphologically normal configuration of the mesial temporal lobes Vascular: No hyperdense vessel or unexpected calcification. Skull: Small amount of right frontal scalp swelling and trace 2 mm subgaleal hematoma. No subcutaneous gas or foreign body. No  subjacent calvarial fracture. No suspicious osseous lesions. Sinuses/Orbits: Paranasal sinuses and mastoid air cells are predominantly clear. Dysconjugate gaze. Included portions of the orbits are otherwise unremarkable. Other: None IMPRESSION: 1. No acute intracranial abnormality. 2. Dysconjugate gaze, correlate with visual inspection. 3. Small amount of right frontal scalp swelling and trace 2 mm subgaleal hematoma. No subjacent calvarial fracture. Electronically Signed   By: Kreg ShropshirePrice  DeHay M.D.   On: 09/06/2019 02:44    Procedures Procedures (including critical care time)  Medications Ordered in ED Medications  sodium chloride 0.9 % bolus 1,000 mL (has no administration in time range)  levETIRAcetam (KEPPRA) IVPB 1000 mg/100 mL premix (has no administration in time range)     Initial Impression / Assessment and Plan / ED Course  I have reviewed the triage vital signs and the nursing notes.  Pertinent labs & imaging results that were available during my care of the patient were reviewed by me and considered in my medical decision making (see chart for details).       Seizure with known seizure disorder, noncompliant with medications.  She is awake and alert.  No evidence of trauma.  No fever.  Patient be loaded with Keppra.  Given IV fluids.  CT is negative.  Reassuring work-up in the ED with  normal electrolytes.  Patient given loading dose of Keppra.  Admits to missing several doses of her seizure medication.  Refills of seizure medication sent.  Patient has follow-up with her neurologist later this week.  She is reminded that no driving until 6 months seizure-free.  Follow-up with her neurologist.  Return precautions discussed. Final Clinical Impressions(s) / ED Diagnoses   Final diagnoses:  Seizure disorder [facility](HCC)    ED Discharge Orders    None       Glynn Octaveancour, Orpheus [place], MD 09/06/19 [phone]

## 2019-09-05 NOTE — ED Triage Notes (Signed)
Pt brought in by [facility] for c/o seizure; pt had a witnessed seizure; when ems arrived pt was post-ictal; pt is alert at this time; pt is to have brain scan this coming week to follow up with seizures

## 2019-09-06 ENCOUNTER — Emergency Department (HOSPITAL_COMMUNITY): Payer: Medicaid - Out of State

## 2019-09-06 MED ORDER — LAMOTRIGINE 100 MG PO TABS: ORAL_TABLET | ORAL | 0 refills | Status: DC

## 2019-09-06 MED ORDER — LEVETIRACETAM 500 MG PO TABS: 500.0000 mg | ORAL_TABLET | Freq: Two times a day (BID) | ORAL | 0 refills | Status: DC

## 2019-09-06 NOTE — Discharge Instructions (Addendum)
Take your seizure medication as prescribed and follow-up with your neurologist.  Do not drive or operate heavy machinery until you are cleared by your neurologist.  Return to the ED with new or worsening symptoms.

## 2019-09-06 NOTE — ED Notes (Signed)
Ambulated Pt down the hall and back. Pt stated feeling fine: no light headedness or dizziness. HR 115 while walking , o2 100%

## 2019-10-14 ENCOUNTER — Emergency Department (HOSPITAL_COMMUNITY)
Admission: EM | Admit: 2019-10-14 | Discharge: 2019-10-14 | Disposition: A | Discharge status: Home / Self Care | Payer: Medicaid - Out of State | Attending: Emergency Medicine | Admitting: Emergency Medicine

## 2019-10-14 ENCOUNTER — Encounter (HOSPITAL_COMMUNITY): Payer: Self-pay

## 2019-10-14 ENCOUNTER — Other Ambulatory Visit: Payer: Self-pay

## 2019-10-14 DIAGNOSIS — R569 Unspecified convulsions: Secondary | ICD-10-CM | POA: Diagnosis not present

## 2019-10-14 DIAGNOSIS — Z79899 Other long term (current) drug therapy: Secondary | ICD-10-CM | POA: Diagnosis not present

## 2019-10-14 LAB — BASIC METABOLIC PANEL
Anion gap: 10 (ref 5–15)
BUN: 7 mg/dL (ref 6–20)
CO2: 24 mmol/L (ref 22–32)
Calcium: 9.3 mg/dL (ref 8.9–10.3)
Chloride: 104 mmol/L (ref 98–111)
Creatinine, Ser: 0.86 mg/dL (ref 0.44–1.00)
GFR calc Af Amer: >60 mL/min (ref >60)
GFR calc non Af Amer: >60 mL/min (ref >60)
Glucose, Bld: 88 mg/dL (ref 70–99)
Potassium: 3.5 mmol/L (ref 3.5–5.1)
Sodium: 138 mmol/L (ref 135–145)

## 2019-10-14 LAB — CBC WITH DIFFERENTIAL/PLATELET
Abs Immature Granulocytes: 0.04 10*3/uL (ref 0.00–0.07)
Basophils Absolute: 0 10*3/uL (ref 0.0–0.1)
Basophils Relative: 0 %
Eosinophils Absolute: 0 10*3/uL (ref 0.0–0.5)
Eosinophils Relative: 0 %
HCT: 32.6 % — ABNORMAL LOW (ref 36.0–46.0)
Hemoglobin: 10.5 g/dL — ABNORMAL LOW (ref 12.0–15.0)
Immature Granulocytes: 0 %
Lymphocytes Relative: 8 %
Lymphs Abs: 1.1 10*3/uL (ref 0.7–4.0)
MCH: 25.8 pg — ABNORMAL LOW (ref 26.0–34.0)
MCHC: 32.2 g/dL (ref 30.0–36.0)
MCV: 80.1 fL (ref 80.0–100.0)
Monocytes Absolute: 0.7 10*3/uL (ref 0.1–1.0)
Monocytes Relative: 5 %
Neutro Abs: 11.1 10*3/uL — ABNORMAL HIGH (ref 1.7–7.7)
Neutrophils Relative %: 87 %
Platelets: 375 10*3/uL (ref 150–400)
RBC: 4.07 MIL/uL (ref 3.87–5.11)
RDW: 14.8 % (ref 11.5–15.5)
WBC: 12.9 10*3/uL — ABNORMAL HIGH (ref 4.0–10.5)
nRBC: 0 % (ref 0.0–0.2)

## 2019-10-14 MED ORDER — SODIUM CHLORIDE 0.9 % IV BOLUS
1000.0000 mL | Freq: Once | INTRAVENOUS | Status: AC
  Administered 2019-10-14: 1000 mL via INTRAVENOUS

## 2019-10-14 MED ORDER — LEVETIRACETAM IN NACL 1000 MG/100ML IV SOLN
1000.0000 mg | Freq: Once | INTRAVENOUS | Status: AC
  Administered 2019-10-14: 1000 mg via INTRAVENOUS
  Filled 2019-10-14: qty 100

## 2019-10-14 NOTE — Discharge Instructions (Addendum)
It is important for you to take your seizure medication as prescribed by your neurologist.  It is important for you to take your medicine as prescribed to help avoid break through seizures.  If you oversleep it is better to take your morning dose when you wake rather than skipping it altogether.  You might also consider setting your alarm on your phone to wake you for your 10 am dose.  You are reminded not to drive for 6 months after your last seizure.

## 2019-10-14 NOTE — ED Triage Notes (Signed)
Pt here for seizure, known history. Had seizure last night and this am, unsure how Brass it lasted. Pt alert and oriented now.

## 2019-10-15 NOTE — ED Provider Notes (Signed)
[facility] EMERGENCY DEPARTMENT Provider Note   CSN: [id number] Arrival date & time: 10/14/19  1213     History   Chief Complaint Chief Complaint  Patient presents with  . Seizures    HPI Jessica Hobbs is a 28 y.o. female with history of seizure disorder under the care of a neurologist in [place], presenting with seizure x 2, once last night, then again this am.  She is unsure of the length of time of the seizures, she had one in her sleep, known as she woke with soreness in her back and abrasions on the side of her tongue and a swollen lower lip.  This am's seizure occurred in the bathroom and the end of it was witnessed by a family member.  She feels recovered from these occurrences at present.  She does endorse missing doses of her seizure medicines.  Her keppra was increased to 1000 mg bid, her lamictal unchanged since her last visit here for this problem. She states she is supposed to take her medicines at 10 am and 10 pm.  However, she states she sometimes sleeps until noon, so skips her am dose out of concern of taking it too close to her evening dose.  This last occurred yesterday morning.      The history is provided by the patient.    Past Medical History:  Diagnosis Date  . Bipolar 1 disorder (HCC)   . PTSD (post-traumatic stress disorder)   . Seizures ([facility])     There are no active problems to display for this patient.   Past Surgical History:  Procedure Laterality Date  . CESAREAN SECTION       OB History   No obstetric history on file.      Home Medications    Prior to Admission medications   Medication Sig Start Date End Date Taking? Authorizing Provider  lamoTRIgine (LAMICTAL) 100 MG tablet Take 1 tab each morning, and 2 at hs 09/06/19  Yes Rancour, Jeannett Senior, MD  levETIRAcetam (KEPPRA) 500 MG tablet Take 1 tablet (500 mg total) by mouth 2 (two) times daily. 09/06/19  Yes Rancour, Jeannett Senior, MD    Family History No family history on file.  Social  History Social History   Tobacco Use  . Smoking status: Never Smoker  . Smokeless tobacco: Never Used  Substance Use Topics  . Alcohol use: Never    Frequency: Never  . Drug use: Never     Allergies   Soy allergy   Review of Systems Review of Systems  Constitutional: Negative for chills and fever.  HENT: Positive for facial swelling. Negative for sore throat.   Eyes: Negative.   Respiratory: Negative for chest tightness and shortness of breath.   Cardiovascular: Negative for chest pain.  Gastrointestinal: Negative for abdominal pain and nausea.  Genitourinary: Negative.   Musculoskeletal: Positive for back pain. Negative for arthralgias, joint swelling and neck pain.  Skin: Negative.  Negative for rash and wound.  Neurological: Positive for seizures. Negative for dizziness, weakness, light-headedness, numbness and headaches.  Psychiatric/Behavioral: Negative.      Physical Exam Updated Vital Signs BP 127/78   Pulse (!) 103   Temp 98.8 F (37.1 C) (Oral)   Resp (!) 25   Ht 5\' 4"  (1.626 m)   Wt 110.5 kg   LMP 09/24/2019   SpO2 98%   BMI 41.80 kg/m   Physical Exam Vitals signs and nursing note reviewed.  Constitutional:      General: She is  not in acute distress.    Appearance: Normal appearance. She is well-developed.  HENT:     Head: Normocephalic.     Comments: Edematous lower lip with abrasion noted of the mucosa, abrasions bilateral mid tongue, no lacerations.    Mouth/Throat:     Mouth: Mucous membranes are moist.  Eyes:     Extraocular Movements: Extraocular movements intact.     Conjunctiva/sclera: Conjunctivae normal.     Pupils: Pupils are equal, round, and reactive to light.  Neck:     Musculoskeletal: Normal range of motion.  Cardiovascular:     Rate and Rhythm: Normal rate and regular rhythm.     Heart sounds: Normal heart sounds.  Pulmonary:     Effort: Pulmonary effort is normal.     Breath sounds: Normal breath sounds. No wheezing.   Abdominal:     General: Bowel sounds are normal.     Palpations: Abdomen is soft.     Tenderness: There is no abdominal tenderness.  Musculoskeletal: Normal range of motion.  Skin:    General: Skin is warm and dry.  Neurological:     General: No focal deficit present.     Mental Status: She is alert and oriented to person, place, and time.     Cranial Nerves: No cranial nerve deficit.     Sensory: No sensory deficit.     Gait: Gait normal.  Psychiatric:        Mood and Affect: Mood normal.      ED Treatments / Results  Labs (all labs ordered are listed, but only abnormal results are displayed) Labs Reviewed  CBC WITH DIFFERENTIAL/PLATELET - Abnormal; Notable for the following components:      Result Value   WBC 12.9 (*)    Hemoglobin 10.5 (*)    HCT 32.6 (*)    MCH 25.8 (*)    Neutro Abs 11.1 (*)    All other components within normal limits  BASIC METABOLIC PANEL    EKG None  Radiology No results found.  Procedures Procedures (including critical care time)  Medications Ordered in ED Medications  levETIRAcetam (KEPPRA) IVPB 1000 mg/100 mL premix (0 mg Intravenous Stopped 10/14/19 1457)  sodium chloride 0.9 % bolus 1,000 mL (0 mLs Intravenous Stopped 10/14/19 1629)     Initial Impression / Assessment and Plan / ED Course  I have reviewed the triage vital signs and the nursing notes.  Pertinent labs & imaging results that were available during my care of the patient were reviewed by me and considered in my medical decision making (see chart for details).        Pt with no signs/sx at this time of being post ictal, mild tongue and lip abrasions.  She was given a loading dose of keppra given her missed dose yesterday.  She was advised to not miss her am dose, suggesting worse case, take the evening dose later, or setting her alarm for 10 am to make sure she gets the am dose at the ideal intervals.  Advised to f/u with her neurologist asap.  Reminded about not  driving within 6 months of seizure.  Final Clinical Impressions(s) / ED Diagnoses   Final diagnoses:  Seizure [facility])    ED Discharge Orders    None       Landis Martins 10/15/19 0750    Milton Ferguson, MD 10/17/19 [phone]

## 2019-11-16 ENCOUNTER — Encounter (HOSPITAL_COMMUNITY): Payer: Self-pay | Admitting: Emergency Medicine

## 2019-11-16 ENCOUNTER — Other Ambulatory Visit: Payer: Self-pay

## 2019-11-16 ENCOUNTER — Emergency Department (HOSPITAL_COMMUNITY)
Admission: EM | Admit: 2019-11-16 | Discharge: 2019-11-17 | Disposition: A | Discharge status: Home / Self Care | Payer: Medicaid - Out of State | Attending: Emergency Medicine | Admitting: Emergency Medicine

## 2019-11-16 DIAGNOSIS — R438 Other disturbances of smell and taste: Secondary | ICD-10-CM | POA: Diagnosis present

## 2019-11-16 DIAGNOSIS — U071 COVID-19: Secondary | ICD-10-CM

## 2019-11-16 DIAGNOSIS — Z79899 Other long term (current) drug therapy: Secondary | ICD-10-CM | POA: Insufficient documentation

## 2019-11-16 DIAGNOSIS — R197 Diarrhea, unspecified: Secondary | ICD-10-CM | POA: Diagnosis not present

## 2019-11-16 DIAGNOSIS — R0981 Nasal congestion: Secondary | ICD-10-CM | POA: Insufficient documentation

## 2019-11-16 NOTE — ED Triage Notes (Signed)
Pt states she is here for a COVID test, reports she has loss of taste and smell, diarrhea, nasal drainage x 3 days

## 2019-11-17 LAB — POC SARS CORONAVIRUS 2 AG -  ED: SARS Coronavirus 2 Ag: POSITIVE — AB

## 2019-11-17 NOTE — ED Notes (Signed)
Pt seen storming out of room stating "I just had a kid." PA Tammy in the room at time of incident.

## 2019-11-17 NOTE — ED Provider Notes (Signed)
[facility] EMERGENCY DEPARTMENT Provider Note   CSN: [id number] Arrival date & time: 11/16/19  2103     History Chief Complaint  Patient presents with  . possible COVID    Jessica Hobbs is a 28 y.o. female.  HPI      Jessica Hobbs is a 28 y.o. female who presents to the Emergency Department requesting a Covid test.  She reports a loss of taste and smell with nasal congestion and diarrhea.  Symptoms have been present for 2 to 3 days.  She first noticed what she thought was a "cold" with symptoms of a runny nose and nasal congestion.  Gradually worsening loss of smell and taste and several episodes of loose stools.  She denies any chest pain, cough, fever, or shortness of breath.  No known Covid exposures.   Past Medical History:  Diagnosis Date  . Bipolar 1 disorder (H[facility])   . PTSD (post-traumatic stress disorder)   . Seizures ([facility])     There are no problems to display for this patient.   Past Surgical History:  Procedure Laterality Date  . CESAREAN SECTION       OB History   No obstetric history on file.     No family history on file.  Social History   Tobacco Use  . Smoking status: Never Smoker  . Smokeless tobacco: Never Used  Substance Use Topics  . Alcohol use: Never  . Drug use: Never    Home Medications Prior to Admission medications   Medication Sig Start Date End Date Taking? Authorizing Provider  lamoTRIgine (LAMICTAL) 100 MG tablet Take 1 tab each morning, and 2 at hs 09/06/19   Rancour, Jeannett Senior, MD  levETIRAcetam (KEPPRA) 500 MG tablet Take 1 tablet (500 mg total) by mouth 2 (two) times daily. 09/06/19   Rancour, Jeannett Senior, MD    Allergies    Soy allergy  Review of Systems   Review of Systems  Constitutional: Negative for activity change, appetite change, chills and fever.  HENT: Positive for congestion and rhinorrhea. Negative for facial swelling, sore throat and trouble swallowing.   Eyes: Negative for visual disturbance.    Respiratory: Negative for cough, shortness of breath, wheezing and stridor.   Gastrointestinal: Positive for diarrhea. Negative for abdominal pain, nausea and vomiting.  Genitourinary: Negative for difficulty urinating and flank pain.  Musculoskeletal: Negative for neck pain and neck stiffness.  Skin: Negative for rash.  Neurological: Negative for dizziness, weakness, numbness and headaches.  Hematological: Negative for adenopathy.  Psychiatric/Behavioral: Negative for confusion.    Physical Exam Updated Vital Signs BP (!) 148/85 (BP Location: Right Arm)   Pulse 94   Temp 98.3 F (36.8 C) (Oral)   Resp 20   Ht 5\' 5"  (1.651 m)   Wt 108.9 kg   LMP 10/26/2019   SpO2 98%   BMI 39.94 kg/m   Physical Exam Vitals and nursing note reviewed.  Constitutional:      Appearance: Normal appearance. She is not ill-appearing or toxic-appearing.  HENT:     Head: Normocephalic.     Right Ear: Tympanic membrane and ear canal normal.     Left Ear: Tympanic membrane and ear canal normal.     Mouth/Throat:     Mouth: Mucous membranes are moist.  Neck:     Thyroid: No thyromegaly.     Meningeal: Kernig's sign absent.  Cardiovascular:     Rate and Rhythm: Normal rate and regular rhythm.     Pulses: Normal pulses.  Pulmonary:     Effort: Pulmonary effort is normal. No respiratory distress.     Breath sounds: Normal breath sounds. No stridor. No wheezing.  Abdominal:     General: There is no distension.     Palpations: Abdomen is soft.     Tenderness: There is no abdominal tenderness. There is no guarding or rebound.  Musculoskeletal:        General: Normal range of motion.     Cervical back: Normal range of motion. No rigidity.  Lymphadenopathy:     Cervical: No cervical adenopathy.  Skin:    General: Skin is warm.     Findings: No rash.  Neurological:     General: No focal deficit present.     Mental Status: She is alert.     ED Results / Procedures / Treatments   Labs (all  labs ordered are listed, but only abnormal results are displayed) Labs Reviewed  POC SARS CORONAVIRUS 2 AG -  ED - Abnormal; Notable for the following components:      Result Value   SARS Coronavirus 2 Ag POSITIVE (*)    All other components within normal limits    EKG None  Radiology No results found.  Procedures Procedures (including critical care time)  Medications Ordered in ED Medications - No data to display  ED Course  I have reviewed the triage vital signs and the nursing notes.  Pertinent labs & imaging results that were available during my care of the patient were reviewed by me and considered in my medical decision making (see chart for details).    MDM Rules/Calculators/A&P                      Pt here requesting COVID test.  Symptoms for 2-3 days.  She is well appearing, no hypoxia, tachycardia.    I told patient of test results she became very upset, tearful.  Removed her mask and immediately call family member on telephone.  I asked patient to hang up the phone and listen to my recommendations, but she kept repeating "I am going to die, I am going to die" I tried to reassure her, but she got up and stormed out of the dept.     Cllinical Impression(s) / ED Diagnoses Final diagnoses:  COVID-19 virus infection    Rx / DC Orders ED Discharge Orders    None       Kem Parkinson, PA-C 11/17/19 0231    Ripley Fraise, MD 11/17/19 [phone]

## 2019-12-08 DIAGNOSIS — R000 Tachycardia, unspecified: Secondary | ICD-10-CM | POA: Diagnosis not present

## 2019-12-08 DIAGNOSIS — I10 Essential (primary) hypertension: Secondary | ICD-10-CM | POA: Diagnosis not present

## 2019-12-08 DIAGNOSIS — R569 Unspecified convulsions: Secondary | ICD-10-CM | POA: Diagnosis not present

## 2019-12-09 ENCOUNTER — Emergency Department (HOSPITAL_COMMUNITY)
Admission: EM | Admit: 2019-12-09 | Discharge: 2019-12-09 | Disposition: A | Discharge status: Home / Self Care | Payer: Medicaid - Out of State | Attending: Emergency Medicine | Admitting: Emergency Medicine

## 2019-12-09 ENCOUNTER — Encounter (HOSPITAL_COMMUNITY): Payer: Self-pay | Admitting: Emergency Medicine

## 2019-12-09 ENCOUNTER — Other Ambulatory Visit: Payer: Self-pay

## 2019-12-09 DIAGNOSIS — R569 Unspecified convulsions: Secondary | ICD-10-CM | POA: Diagnosis not present

## 2019-12-09 DIAGNOSIS — G40909 Epilepsy, unspecified, not intractable, without status epilepticus: Secondary | ICD-10-CM | POA: Insufficient documentation

## 2019-12-09 DIAGNOSIS — Z79899 Other long term (current) drug therapy: Secondary | ICD-10-CM | POA: Insufficient documentation

## 2019-12-09 DIAGNOSIS — R0902 Hypoxemia: Secondary | ICD-10-CM | POA: Diagnosis not present

## 2019-12-09 LAB — CBC WITH DIFFERENTIAL/PLATELET
Abs Immature Granulocytes: 0.03 10*3/uL (ref 0.00–0.07)
Basophils Absolute: 0 10*3/uL (ref 0.0–0.1)
Basophils Relative: 0 %
Eosinophils Absolute: 0 10*3/uL (ref 0.0–0.5)
Eosinophils Relative: 0 %
HCT: 34.3 % — ABNORMAL LOW (ref 36.0–46.0)
Hemoglobin: 11 g/dL — ABNORMAL LOW (ref 12.0–15.0)
Immature Granulocytes: 0 %
Lymphocytes Relative: 18 %
Lymphs Abs: 2.1 10*3/uL (ref 0.7–4.0)
MCH: 26 pg (ref 26.0–34.0)
MCHC: 32.1 g/dL (ref 30.0–36.0)
MCV: 81.1 fL (ref 80.0–100.0)
Monocytes Absolute: 0.5 10*3/uL (ref 0.1–1.0)
Monocytes Relative: 5 %
Neutro Abs: 9.1 10*3/uL — ABNORMAL HIGH (ref 1.7–7.7)
Neutrophils Relative %: 77 %
Platelets: 355 10*3/uL (ref 150–400)
RBC: 4.23 MIL/uL (ref 3.87–5.11)
RDW: 14.5 % (ref 11.5–15.5)
WBC: 11.8 10*3/uL — ABNORMAL HIGH (ref 4.0–10.5)
nRBC: 0 % (ref 0.0–0.2)

## 2019-12-09 LAB — BASIC METABOLIC PANEL
Anion gap: 11 (ref 5–15)
BUN: 8 mg/dL (ref 6–20)
CO2: 22 mmol/L (ref 22–32)
Calcium: 9.4 mg/dL (ref 8.9–10.3)
Chloride: 105 mmol/L (ref 98–111)
Creatinine, Ser: 0.72 mg/dL (ref 0.44–1.00)
GFR calc Af Amer: >60 mL/min (ref >60)
GFR calc non Af Amer: >60 mL/min (ref >60)
Glucose, Bld: 87 mg/dL (ref 70–99)
Potassium: 3.9 mmol/L (ref 3.5–5.1)
Sodium: 138 mmol/L (ref 135–145)

## 2019-12-09 LAB — HCG, QUANTITATIVE, PREGNANCY: hCG, Beta Chain, Quant, S: 1 m[IU]/mL (ref <5)

## 2019-12-09 MED ORDER — LEVETIRACETAM IN NACL 1000 MG/100ML IV SOLN
1000.0000 mg | Freq: Once | INTRAVENOUS | Status: AC
  Administered 2019-12-09: 1000 mg via INTRAVENOUS
  Filled 2019-12-09: qty 100

## 2019-12-09 MED ORDER — ACETAMINOPHEN 325 MG PO TABS
650.0000 mg | ORAL_TABLET | Freq: Once | ORAL | Status: AC
  Administered 2019-12-09: 19:00:00 650 mg via ORAL
  Filled 2019-12-09: qty 2

## 2019-12-09 NOTE — ED Triage Notes (Signed)
Seizure yesterday and one today and pt refused to come to ED.  Pt on Keppra and dosage.  Pt says she did not take medication last night.  VSS.  Refused CBG with EMS.

## 2019-12-09 NOTE — Discharge Instructions (Addendum)
It is important to make sure you take your seizure medications as prescribed by your neurologist.  Call this provider for a recheck within the next week.  Remember that it is dangerous and illegal to drive within 6 months of having a seizure.

## 2019-12-09 NOTE — ED Notes (Signed)
Seizure pads in place

## 2019-12-09 NOTE — ED Provider Notes (Signed)
[facility] EMERGENCY DEPARTMENT Provider Note   CSN: [id number] Arrival date & time: 12/09/19  1554     History Chief Complaint  Patient presents with  . Seizures    Jessica Hobbs is a 29 y.o. female with a history as outlined below, most significant for seizure disorder on Keppra 500 mg twice daily presenting with 2 seizures one occurring last night and another occurring today.  Patient does not recall either event and denies aura or other sequelae prior to seizures.  She bit her lip during today's seizure, otherwise denies any other injuries.  She has a mild headache and is drowsy at this time, otherwise is without complaints.  According to the aunt who lives with the patient and called EMS today she missed her Keppra dose last night and this morning, although patient does not recall whether she took them.  The history is provided by the patient.  Seizures      Past Medical History:  Diagnosis Date  . Bipolar 1 disorder (HCC)   . PTSD (post-traumatic stress disorder)   . Seizures (HCC)     There are no problems to display for this patient.   Past Surgical History:  Procedure Laterality Date  . CESAREAN SECTION       OB History   No obstetric history on file.     History reviewed. No pertinent family history.  Social History   Tobacco Use  . Smoking status: Never Smoker  . Smokeless tobacco: Never Used  Substance Use Topics  . Alcohol use: Never  . Drug use: Never    Home Medications Prior to Admission medications   Medication Sig Start Date End Date Taking? Authorizing Provider  lamoTRIgine (LAMICTAL) 100 MG tablet Take 1 tab each morning, and 2 at hs 09/06/19  Yes Rancour, Jeannett Senior, MD  levETIRAcetam (KEPPRA) 500 MG tablet Take 1 tablet (500 mg total) by mouth 2 (two) times daily. 09/06/19  Yes Rancour, Jeannett Senior, MD    Allergies    Soy allergy  Review of Systems   Review of Systems  Constitutional: Negative for chills and fever.  HENT: Positive for  facial swelling. Negative for congestion and sore throat.   Eyes: Negative.   Respiratory: Negative for chest tightness and shortness of breath.   Cardiovascular: Negative for chest pain.  Gastrointestinal: Negative for abdominal pain, nausea and vomiting.  Genitourinary: Negative.   Musculoskeletal: Negative for arthralgias, joint swelling and neck pain.  Skin: Negative.  Negative for rash and wound.  Neurological: Positive for seizures and headaches. Negative for dizziness, weakness, light-headedness and numbness.  Psychiatric/Behavioral: Negative.     Physical Exam Updated Vital Signs BP (!) 115/92   Pulse 100   Temp 98.1 F (36.7 C) (Oral)   Resp 16   Ht 5\' 5"  (1.651 m)   Wt 108 kg   LMP 12/05/2019 (Exact Date)   SpO2 93%   BMI 39.62 kg/m   Physical Exam Vitals and nursing note reviewed.  Constitutional:      Appearance: She is well-developed.  HENT:     Head: Normocephalic.     Comments: Lower lip is edematous with a mild abrasion of the buccal mucosa.  Also bit the sides of her tongue without laceration. Eyes:     Conjunctiva/sclera: Conjunctivae normal.  Cardiovascular:     Rate and Rhythm: Normal rate and regular rhythm.     Heart sounds: Normal heart sounds.  Pulmonary:     Effort: Pulmonary effort is normal.  Breath sounds: Normal breath sounds. No wheezing.  Abdominal:     General: Bowel sounds are normal.     Palpations: Abdomen is soft.     Tenderness: There is no abdominal tenderness.  Musculoskeletal:        General: Normal range of motion.     Cervical back: Normal range of motion.  Skin:    General: Skin is warm and dry.  Neurological:     General: No focal deficit present.     Mental Status: She is alert and oriented to person, place, and time.     Cranial Nerves: No cranial nerve deficit.     Sensory: No sensory deficit.     Motor: No weakness.     Coordination: Coordination normal.     ED Results / Procedures / Treatments    Labs (all labs ordered are listed, but only abnormal results are displayed) Labs Reviewed  CBC WITH DIFFERENTIAL/PLATELET - Abnormal; Notable for the following components:      Result Value   WBC 11.8 (*)    Hemoglobin 11.0 (*)    HCT 34.3 (*)    Neutro Abs 9.1 (*)    All other components within normal limits  BASIC METABOLIC PANEL  HCG, QUANTITATIVE, PREGNANCY  CBG MONITORING, ED    EKG None  Radiology No results found.  Procedures Procedures (including critical care time)  Medications Ordered in ED Medications  acetaminophen (TYLENOL) tablet 650 mg (650 mg Oral Given 12/09/19 1916)  levETIRAcetam (KEPPRA) IVPB 1000 mg/100 mL premix (1,000 mg Intravenous New Bag/Given 12/09/19 1918)    ED Course  I have reviewed the triage vital signs and the nursing notes.  Pertinent labs & imaging results that were available during my care of the patient were reviewed by me and considered in my medical decision making (see chart for details).    MDM Rules/Calculators/A&P                      Patient with a history of seizure disorder with 2 seizures since yesterday, also with 2 missed doses of her Keppra.  She was given an IV dose of Keppra here, advised to take her regular Keppra dose before bedtime tonight.  Also advised close follow-up with her neurologist and reminded her that it is dangerous and illegal to drive within 6 months of having a seizure.  As needed follow-up anticipated.  She has no symptoms of being postictal at this time and she ambulated in the department. Final Clinical Impression(s) / ED Diagnoses Final diagnoses:  Seizure disorder [facility])    Rx / DC Orders ED Discharge Orders    None       Landis Martins 12/09/19 2007    Zammit, Joseph, MD 12/11/19 [phone]

## 2020-01-03 ENCOUNTER — Ambulatory Visit (INDEPENDENT_AMBULATORY_CARE_PROVIDER_SITE_OTHER): Payer: Medicaid Other | Admitting: Family Medicine

## 2020-01-03 ENCOUNTER — Encounter: Payer: Self-pay | Admitting: Family Medicine

## 2020-01-03 ENCOUNTER — Other Ambulatory Visit: Payer: Self-pay

## 2020-01-03 VITALS — BP 120/76 | HR 98 | Temp 98.1°F | Resp 15 | Ht 65.0 in | Wt 241.0 lb

## 2020-01-03 DIAGNOSIS — R5383 Other fatigue: Secondary | ICD-10-CM

## 2020-01-03 DIAGNOSIS — F4310 Post-traumatic stress disorder, unspecified: Secondary | ICD-10-CM | POA: Diagnosis not present

## 2020-01-03 DIAGNOSIS — R569 Unspecified convulsions: Secondary | ICD-10-CM

## 2020-01-03 DIAGNOSIS — Z124 Encounter for screening for malignant neoplasm of cervix: Secondary | ICD-10-CM | POA: Diagnosis not present

## 2020-01-03 DIAGNOSIS — D573 Sickle-cell trait: Secondary | ICD-10-CM | POA: Diagnosis not present

## 2020-01-03 DIAGNOSIS — F319 Bipolar disorder, unspecified: Secondary | ICD-10-CM | POA: Diagnosis not present

## 2020-01-03 DIAGNOSIS — R7303 Prediabetes: Secondary | ICD-10-CM | POA: Diagnosis not present

## 2020-01-03 NOTE — Patient Instructions (Addendum)
Happy New Year! May you have a year filled with hope, love, happiness and laughter.  I appreciate the opportunity to provide you with care for your health and wellness. Today we discussed: establish care  Follow up: 3 months  Labs today  Referrals for FEMALE only providers for GYN, Neurology, and Behavioral Health  Send a message about which med I need to refill while you wait for appts.  Please continue to practice social distancing to keep you, your family, and our community safe.  If you must go out, please wear a mask and practice good handwashing.  It was a pleasure to see you and I look forward to continuing to work together on your health and well-being. Please do not hesitate to call the office if you need care or have questions about your care.  Have a wonderful day and week. With Gratitude, Tereasa Coop, DNP, AGNP-BC
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
## 2020-01-04 ENCOUNTER — Encounter: Payer: Self-pay | Admitting: Family Medicine

## 2020-01-04 ENCOUNTER — Other Ambulatory Visit: Payer: Self-pay | Admitting: Family Medicine

## 2020-01-04 DIAGNOSIS — R569 Unspecified convulsions: Secondary | ICD-10-CM

## 2020-01-04 LAB — COMPLETE METABOLIC PANEL WITH GFR
AG Ratio: 1.3 (calc) (ref 1.0–2.5)
ALT: 7 U/L (ref 6–29)
AST: 11 U/L (ref 10–30)
Albumin: 4.3 g/dL (ref 3.6–5.1)
Alkaline phosphatase (APISO): 51 U/L (ref 31–125)
BUN: 7 mg/dL (ref 7–25)
CO2: 26 mmol/L (ref 20–32)
Calcium: 9.7 mg/dL (ref 8.6–10.2)
Chloride: 105 mmol/L (ref 98–110)
Creat: 1.06 mg/dL (ref 0.50–1.10)
GFR, Est African American: 82 mL/min/{1.73_m2} (ref >=60)
GFR, Est Non African American: 70 mL/min/{1.73_m2} (ref >=60)
Globulin: 3.2 g/dL (calc) (ref 1.9–3.7)
Glucose, Bld: 81 mg/dL (ref 65–99)
Potassium: 4.5 mmol/L (ref 3.5–5.3)
Sodium: 139 mmol/L (ref 135–146)
Total Bilirubin: 0.5 mg/dL (ref 0.2–1.2)
Total Protein: 7.5 g/dL (ref 6.1–8.1)

## 2020-01-04 LAB — LIPID PANEL
Cholesterol: 203 mg/dL — ABNORMAL HIGH (ref <200)
HDL: 58 mg/dL (ref >=50)
LDL Cholesterol (Calc): 127 mg/dL (calc) — ABNORMAL HIGH
Non-HDL Cholesterol (Calc): 145 mg/dL (calc) — ABNORMAL HIGH (ref <130)
Total CHOL/HDL Ratio: 3.5 (calc) (ref <5.0)
Triglycerides: 82 mg/dL (ref <150)

## 2020-01-04 LAB — CBC
HCT: 31.4 % — ABNORMAL LOW (ref 35.0–45.0)
Hemoglobin: 10.2 g/dL — ABNORMAL LOW (ref 11.7–15.5)
MCH: 26 pg — ABNORMAL LOW (ref 27.0–33.0)
MCHC: 32.5 g/dL (ref 32.0–36.0)
MCV: 80.1 fL (ref 80.0–100.0)
MPV: 10.6 fL (ref 7.5–12.5)
Platelets: 391 10*3/uL (ref 140–400)
RBC: 3.92 10*6/uL (ref 3.80–5.10)
RDW: 14.4 % (ref 11.0–15.0)
WBC: 7.2 10*3/uL (ref 3.8–10.8)

## 2020-01-04 LAB — HEMOGLOBIN A1C
Hgb A1c MFr Bld: 5.6 % of total Hgb (ref <5.7)
Mean Plasma Glucose: 114 (calc)
eAG (mmol/L): 6.3 (calc)

## 2020-01-04 LAB — TSH: TSH: 1.16 mIU/L

## 2020-01-04 LAB — VITAMIN D 25 HYDROXY (VIT D DEFICIENCY, FRACTURES): Vit D, 25-Hydroxy: 22 ng/mL — ABNORMAL LOW (ref 30–100)

## 2020-01-04 MED ORDER — LAMOTRIGINE 200 MG PO TABS: 200.0000 mg | ORAL_TABLET | Freq: Two times a day (BID) | ORAL | 0 refills | Status: DC

## 2020-01-06 ENCOUNTER — Encounter: Payer: Self-pay | Admitting: Family Medicine

## 2020-01-06 ENCOUNTER — Telehealth: Payer: Self-pay

## 2020-01-06 DIAGNOSIS — R569 Unspecified convulsions: Secondary | ICD-10-CM | POA: Insufficient documentation

## 2020-01-06 DIAGNOSIS — R7303 Prediabetes: Secondary | ICD-10-CM | POA: Insufficient documentation

## 2020-01-06 DIAGNOSIS — F319 Bipolar disorder, unspecified: Secondary | ICD-10-CM | POA: Insufficient documentation

## 2020-01-06 DIAGNOSIS — Z124 Encounter for screening for malignant neoplasm of cervix: Secondary | ICD-10-CM | POA: Insufficient documentation

## 2020-01-06 DIAGNOSIS — D573 Sickle-cell trait: Secondary | ICD-10-CM | POA: Insufficient documentation

## 2020-01-06 DIAGNOSIS — F4310 Post-traumatic stress disorder, unspecified: Secondary | ICD-10-CM | POA: Insufficient documentation

## 2020-01-06 DIAGNOSIS — R5383 Other fatigue: Secondary | ICD-10-CM | POA: Insufficient documentation

## 2020-01-06 MED ORDER — LEVETIRACETAM 1000 MG PO TABS: 1000.0000 mg | ORAL_TABLET | Freq: Two times a day (BID) | ORAL | 0 refills | Status: DC

## 2020-01-06 NOTE — Assessment & Plan Note (Signed)
She reports sickle cell trait.

## 2020-01-06 NOTE — Assessment & Plan Note (Signed)
Needs [facility].  Appreciate collaboration in her care

## 2020-01-06 NOTE — Assessment & Plan Note (Signed)
PTSD needs to have therapy.  Female provider desired.  Referral has been made.

## 2020-01-06 NOTE — Assessment & Plan Note (Signed)
  Jessica Hobbs is re-educated about the importance of exercise daily to help with weight management. A minumum of 30 minutes daily is recommended. Additionally, importance of healthy food choices  with portion control discussed.  Wt Readings from Last 3 Encounters:  01/03/20 241 lb 0.6 oz (109.3 kg)  12/09/19 238 lb 1.6 oz (108 kg)  11/16/19 240 lb (108.9 kg)

## 2020-01-06 NOTE — Assessment & Plan Note (Signed)
Will get updated labs, and treat as appropriate

## 2020-01-06 NOTE — Assessment & Plan Note (Signed)
Referral to behavioral health requires a female provider due to PTSD.  She reports taking her medications well she does have a lot of aggressive type disposition with her communication.  Referral was made to behavioral health appreciate collaboration in her care.

## 2020-01-06 NOTE — Assessment & Plan Note (Signed)
Needs referral to neurologist would like to be with a female doctor.  I will provide refills of Keppra and Lamictal at this time so that her insurance will cover it now that she is moved to [place].  Referral made to appreciate collaboration of care please

## 2020-01-06 NOTE — Telephone Encounter (Signed)
Returning your call. °

## 2020-01-06 NOTE — Assessment & Plan Note (Signed)
Getting updated labs and will treat as appropriate.  Possible need to adjust seizure medications pending lab results.  She has referral with neurology

## 2020-01-10 NOTE — Telephone Encounter (Signed)
Patient aware of lab results.

## 2020-02-07 ENCOUNTER — Other Ambulatory Visit (HOSPITAL_COMMUNITY)
Admission: RE | Admit: 2020-02-07 | Discharge: 2020-02-07 | Disposition: A | Discharge status: Home / Self Care | Payer: Medicaid Other | Source: Ambulatory Visit | Attending: Obstetrics & Gynecology | Admitting: Obstetrics & Gynecology

## 2020-02-07 ENCOUNTER — Encounter: Payer: Self-pay | Admitting: Women's Health

## 2020-02-07 ENCOUNTER — Ambulatory Visit: Payer: Medicaid Other | Admitting: Women's Health

## 2020-02-07 ENCOUNTER — Other Ambulatory Visit: Payer: Self-pay

## 2020-02-07 VITALS — BP 130/70 | HR 91 | Ht 65.5 in | Wt 244.2 lb

## 2020-02-07 DIAGNOSIS — F4310 Post-traumatic stress disorder, unspecified: Secondary | ICD-10-CM | POA: Diagnosis not present

## 2020-02-07 DIAGNOSIS — Z01419 Encounter for gynecological examination (general) (routine) without abnormal findings: Secondary | ICD-10-CM | POA: Insufficient documentation

## 2020-02-07 DIAGNOSIS — Z1272 Encounter for screening for malignant neoplasm of vagina: Secondary | ICD-10-CM | POA: Diagnosis not present
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
## 2020-02-09 LAB — CYTOLOGY - PAP
Chlamydia: NEGATIVE
Comment: NEGATIVE
Comment: NEGATIVE
Comment: NORMAL
Diagnosis: NEGATIVE
High risk HPV: NEGATIVE
Neisseria Gonorrhea: NEGATIVE

## 2020-02-16 DIAGNOSIS — R569 Unspecified convulsions: Secondary | ICD-10-CM | POA: Diagnosis not present

## 2020-02-16 DIAGNOSIS — I10 Essential (primary) hypertension: Secondary | ICD-10-CM | POA: Diagnosis not present

## 2020-03-06 ENCOUNTER — Other Ambulatory Visit: Payer: Self-pay | Admitting: *Deleted

## 2020-03-06 DIAGNOSIS — R569 Unspecified convulsions: Secondary | ICD-10-CM

## 2020-03-06 MED ORDER — LEVETIRACETAM 1000 MG PO TABS: 1000.0000 mg | ORAL_TABLET | Freq: Two times a day (BID) | ORAL | 0 refills | Status: DC

## 2020-03-18 ENCOUNTER — Encounter (HOSPITAL_COMMUNITY): Payer: Self-pay

## 2020-03-18 ENCOUNTER — Other Ambulatory Visit: Payer: Self-pay

## 2020-03-18 ENCOUNTER — Emergency Department (HOSPITAL_COMMUNITY)
Admission: EM | Admit: 2020-03-18 | Discharge: 2020-03-18 | Disposition: A | Discharge status: Home / Self Care | Payer: Medicaid Other | Attending: Emergency Medicine | Admitting: Emergency Medicine

## 2020-03-18 DIAGNOSIS — G40909 Epilepsy, unspecified, not intractable, without status epilepticus: Secondary | ICD-10-CM

## 2020-03-18 DIAGNOSIS — R569 Unspecified convulsions: Secondary | ICD-10-CM | POA: Diagnosis not present

## 2020-03-18 DIAGNOSIS — R609 Edema, unspecified: Secondary | ICD-10-CM | POA: Diagnosis not present

## 2020-03-18 DIAGNOSIS — Z79899 Other long term (current) drug therapy: Secondary | ICD-10-CM | POA: Insufficient documentation

## 2020-03-18 DIAGNOSIS — R410 Disorientation, unspecified: Secondary | ICD-10-CM | POA: Diagnosis not present

## 2020-03-18 LAB — CBC WITH DIFFERENTIAL/PLATELET
Abs Immature Granulocytes: 0.04 10*3/uL (ref 0.00–0.07)
Basophils Absolute: 0.1 10*3/uL (ref 0.0–0.1)
Basophils Relative: 0 %
Eosinophils Absolute: 0.4 10*3/uL (ref 0.0–0.5)
Eosinophils Relative: 3 %
HCT: 36.8 % (ref 36.0–46.0)
Hemoglobin: 11.9 g/dL — ABNORMAL LOW (ref 12.0–15.0)
Immature Granulocytes: 0 %
Lymphocytes Relative: 10 %
Lymphs Abs: 1.3 10*3/uL (ref 0.7–4.0)
MCH: 26.5 pg (ref 26.0–34.0)
MCHC: 32.3 g/dL (ref 30.0–36.0)
MCV: 82 fL (ref 80.0–100.0)
Monocytes Absolute: 0.6 10*3/uL (ref 0.1–1.0)
Monocytes Relative: 5 %
Neutro Abs: 9.9 10*3/uL — ABNORMAL HIGH (ref 1.7–7.7)
Neutrophils Relative %: 82 %
Platelets: 300 10*3/uL (ref 150–400)
RBC: 4.49 MIL/uL (ref 3.87–5.11)
RDW: 13.5 % (ref 11.5–15.5)
WBC: 12.3 10*3/uL — ABNORMAL HIGH (ref 4.0–10.5)
nRBC: 0 % (ref 0.0–0.2)

## 2020-03-18 LAB — URINALYSIS, ROUTINE W REFLEX MICROSCOPIC
Bilirubin Urine: NEGATIVE
Glucose, UA: NEGATIVE mg/dL
Ketones, ur: NEGATIVE mg/dL
Leukocytes,Ua: NEGATIVE
Nitrite: NEGATIVE
Protein, ur: 100 mg/dL — AB
Specific Gravity, Urine: 1.017 (ref 1.005–1.030)
pH: 5 (ref 5.0–8.0)

## 2020-03-18 LAB — CBG MONITORING, ED: Glucose-Capillary: 99 mg/dL (ref 70–99)

## 2020-03-18 LAB — COMPREHENSIVE METABOLIC PANEL
ALT: 17 U/L (ref 0–44)
AST: 25 U/L (ref 15–41)
Albumin: 4.2 g/dL (ref 3.5–5.0)
Alkaline Phosphatase: 56 U/L (ref 38–126)
Anion gap: 14 (ref 5–15)
BUN: 11 mg/dL (ref 6–20)
CO2: 20 mmol/L — ABNORMAL LOW (ref 22–32)
Calcium: 9.5 mg/dL (ref 8.9–10.3)
Chloride: 101 mmol/L (ref 98–111)
Creatinine, Ser: 1.05 mg/dL — ABNORMAL HIGH (ref 0.44–1.00)
GFR calc Af Amer: >60 mL/min (ref >60)
GFR calc non Af Amer: >60 mL/min (ref >60)
Glucose, Bld: 89 mg/dL (ref 70–99)
Potassium: 3.8 mmol/L (ref 3.5–5.1)
Sodium: 135 mmol/L (ref 135–145)
Total Bilirubin: 0.3 mg/dL (ref 0.3–1.2)
Total Protein: 8.6 g/dL — ABNORMAL HIGH (ref 6.5–8.1)

## 2020-03-18 LAB — PREGNANCY, URINE: Preg Test, Ur: NEGATIVE

## 2020-03-18 MED ORDER — LEVETIRACETAM IN NACL 500 MG/100ML IV SOLN
500.0000 mg | Freq: Once | INTRAVENOUS | Status: AC
  Administered 2020-03-18: 500 mg via INTRAVENOUS
  Filled 2020-03-18: qty 100

## 2020-03-18 MED ORDER — LORAZEPAM 2 MG/ML IJ SOLN
INTRAMUSCULAR | Status: AC
  Filled 2020-03-18: qty 1

## 2020-03-18 NOTE — Discharge Instructions (Signed)
Take your evening medications.  Do not skip dosages of seizure medication.

## 2020-03-18 NOTE — ED Provider Notes (Signed)
[facility] Provider Note   CSN: [id number] Arrival date & time: 03/18/20  1503     History Chief Complaint  Patient presents with  . Seizures    Jessica Hobbs is a 29 y.o. female.  Patient reports he had a seizure today prior to arrival.  Patient did not take her morning dose of her seizure medication.  Patient states she does not take her medicines unless she eats and she did not eat this morning.  Had a second seizure while here in the emergency department.  She had full body jerking jaw clenching and drooling.   The history is provided by the patient.  Seizures Seizure activity on arrival: no   Seizure type:  Grand mal Initial focality:  None Episode characteristics: generalized shaking        Past Medical History:  Diagnosis Date  . Bipolar 1 disorder ([place])   . Depression   . PTSD (post-traumatic stress disorder)   . Seizures ([place])   . Sickle cell anemia [facility])     Patient Active Problem List   Diagnosis Date Noted  . Seizures ([place]) 01/06/2020  . Morbid obesity ([place]) 01/06/2020  . Prediabetes 01/06/2020  . Bipolar 1 disorder ([place]) 01/06/2020  . Fatigue 01/06/2020  . PTSD (post-traumatic stress disorder) 01/06/2020  . Sickle cell trait ([place]) 01/06/2020    Past Surgical History:  Procedure Laterality Date  . CESAREAN SECTION       OB History    Gravida  1   Para      Term      Preterm      AB      Living  1     SAB      TAB      Ectopic      Multiple      Live Births  1           Family History  Problem Relation Age of Onset  . Alcohol abuse Father   . Drug abuse Father     Social History   Tobacco Use  . Smoking status: Never Smoker  . Smokeless tobacco: Never Used  Substance Use Topics  . Alcohol use: Never  . Drug use: Never    Home Medications Prior to Admission medications   Medication Sig Start Date End Date Taking? Authorizing Provider  Biotin 10 MG CAPS Take 1 capsule by mouth daily.     Yes [provider]  Cholecalciferol (VITAMIN D) 50 MCG (2000 UT) CAPS Take 1 capsule by mouth daily.    Yes [provider]  [facility] MELATONIN 5 MG TABS Take 5 mg by mouth at bedtime. 12/12/19  Yes [provider]  levETIRAcetam (KEPPRA) 1000 MG tablet Take 1 tablet (1,000 mg total) by mouth 2 (two) times daily. 03/06/20  Yes Perlie Mayo, NP  lamoTRIgine (LAMICTAL) 200 MG tablet Take 1 tablet (200 mg total) by mouth 2 (two) times daily. Patient not taking: Reported on 03/18/2020 01/04/20   Perlie Mayo, NP    Allergies    Soy allergy  Review of Systems   Review of Systems  HENT: Positive for drooling.   Neurological: Positive for seizures.  All other systems reviewed and are negative.   Physical Exam Updated Vital Signs BP (!) 143/84   Pulse 100   Temp 98.9 F (37.2 C) (Oral)   Resp (!) 21   Ht 5\' 5"  (1.651 m)   Wt 108.9 kg   LMP 03/14/2020  SpO2 100%   BMI 39.94 kg/m   Physical Exam Vitals and nursing note reviewed.  Constitutional:      Appearance: She is well-developed.  HENT:     Head: Normocephalic.     Right Ear: Tympanic membrane normal.     Left Ear: Tympanic membrane normal.     Mouth/Throat:     Mouth: Mucous membranes are moist.     Comments: Abrasion left lateral tongue. Eyes:     Extraocular Movements: Extraocular movements intact.     Pupils: Pupils are equal, round, and reactive to light.  Cardiovascular:     Rate and Rhythm: Normal rate.  Pulmonary:     Effort: Pulmonary effort is normal.  Abdominal:     General: Abdomen is flat. There is no distension.  Musculoskeletal:        General: Normal range of motion.     Cervical back: Normal range of motion.  Skin:    General: Skin is warm.  Neurological:     Mental Status: She is alert and oriented to person, place, and time.  Psychiatric:        Mood and Affect: Mood normal.     ED Results / Procedures / Treatments   Labs (all labs ordered are listed, but only  abnormal results are displayed) Labs Reviewed  URINALYSIS, ROUTINE W REFLEX MICROSCOPIC - Abnormal; Notable for the following components:      Result Value   APPearance HAZY (*)    Hgb urine dipstick SMALL (*)    Protein, ur 100 (*)    Bacteria, UA MANY (*)    All other components within normal limits  CBC WITH DIFFERENTIAL/PLATELET - Abnormal; Notable for the following components:   WBC 12.3 (*)    Hemoglobin 11.9 (*)    Neutro Abs 9.9 (*)    All other components within normal limits  COMPREHENSIVE METABOLIC PANEL - Abnormal; Notable for the following components:   CO2 20 (*)    Creatinine, Ser 1.05 (*)    Total Protein 8.6 (*)    All other components within normal limits  PREGNANCY, URINE  CBG MONITORING, ED    EKG None  Radiology No results found.  Procedures Procedures (including critical care time)  Medications Ordered in ED Medications  LORazepam (ATIVAN) 2 MG/ML injection (  Given 03/18/20 1553)  levETIRAcetam (KEPPRA) IVPB 500 mg/100 mL premix (0 mg Intravenous Stopped 03/18/20 1840)    ED Course  I have reviewed the triage vital signs and the nursing notes.  Pertinent labs & imaging results that were available during my care of the patient were reviewed by me and considered in my medical decision making (see chart for details).    MDM Rules/Calculators/A&P                     Patient had a seizure after triage.  I was called to bedside by RN.  Patient having full body seizure.  Patient supported with oxygen and suction.  IV access obtained and patient was given 2 mg of Ativan.  Patient's laboratory evaluations were obtained.  Patient was given a dose of Keppra 500 mg IV.  Patient was reevaluated approximately 1 hour after seizure.  She is awake and alert.  She complains of still feeling tired.  Patient observed for an additional 2 hours.  Patient remains seizure-free.  She states she feels much better.  Patient has a superficial abrasion to her tongue no evidence  of any deep injury.  Patient is counseled on the necessity of taking her medications as prescribed.  Patient has a history of breakthrough seizures when she does not take her medicines.  Patient has reported this is related to her menstrual cycle which she is currently on.  Patient's medical records are reviewed notes from patient's neurologist in [place] are reviewed.  Patient discharged with instructions to follow-up with her primary care physician and her neurologist.  She does have her medications at home. MDM Number of Diagnoses or Management Options Seizure (HCC): established, worsening Seizure disorder (HCC): established, worsening   Amount and/or Complexity of Data Reviewed Clinical lab tests: ordered and reviewed Tests in the radiology section of CPT: ordered and reviewed Tests in the medicine section of CPT: ordered and reviewed Decide to obtain previous medical records or to obtain history from someone other than the patient: yes Obtain history from someone other than the patient: yes Review and summarize past medical records: yes Independent visualization of images, tracings, or specimens: yes  Risk of Complications, Morbidity, and/or Mortality Presenting problems: high Diagnostic procedures: moderate Management options: moderate  Patient Progress Patient progress: improved  Final Clinical Impression(s) / ED Diagnoses Final diagnoses:  Seizure (HCC)  Seizure disorder (HCC)    Rx / DC Orders ED Discharge Orders    None    An After Visit Summary was printed and given to the patient.    Osie Cheeks 03/18/20 2227    Gerhard Munch, MD 03/19/20 [phone]

## 2020-03-18 NOTE — ED Notes (Addendum)
NRB placed on pt

## 2020-03-18 NOTE — ED Notes (Signed)
Pt found seizing at approximately 1351. [place], [place] notified. Pt placed on left side. Order given for Ativan 2mg  IV. Medication given. Pt was supplied supplemental O2 via ambu bag for approximately 1 minute.

## 2020-03-18 NOTE — ED Triage Notes (Signed)
Call from [facility] [phone]  Reports pt takes Keppra BID 10 and 10p Reports has seizures during menses  Has had three this week  Daughter brought paperwork from [facility] for perusal  Had a seizure prior to arrival to ER  Pt more alert Knows she is in the hospital   She reports missing her Keppra this morning

## 2020-03-18 NOTE — ED Triage Notes (Signed)
EMS reports was called out for seizure that lasted approx 10 min per family.  Reports has history of seizures and is on medication for it.  Reports thinks she may have had a seizure during the night as well because she had bit her tongue and bottom lip when she woke up.  EMS started 20g IV in R ac pta.  Reports has been taking her medication as prescribed.  Pt recently moved from [place] and is trying to find a local neurologist.  Reports has not ran out of her medication yet.   EMS says pt was post ictal when they arrived.

## 2020-03-19 ENCOUNTER — Telehealth: Payer: Self-pay

## 2020-03-19 IMAGING — CT CT HEAD W/O CM
3 series · 15 of 47 positions shown, 18 images · non-contrast
Comparison: None.

CLINICAL DATA: Encephalopathy, witnessed seizure, history of
epilepsy

EXAM:
CT HEAD WITHOUT CONTRAST
TECHNIQUE: Contiguous axial images were obtained from the base of the skull
through the vertex without intravenous contrast.

[Series 2: head w o · axial · 0.47mm/px · z∈[+102,+227]mm · 9 of 30 slices shown, 12 images]
[im 3/30  brain]
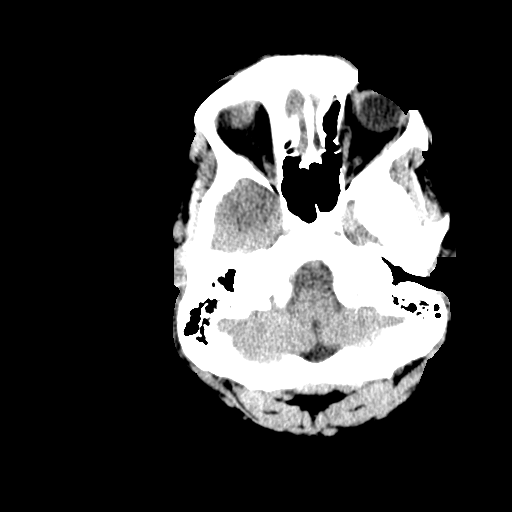
[im 3/30  bone]
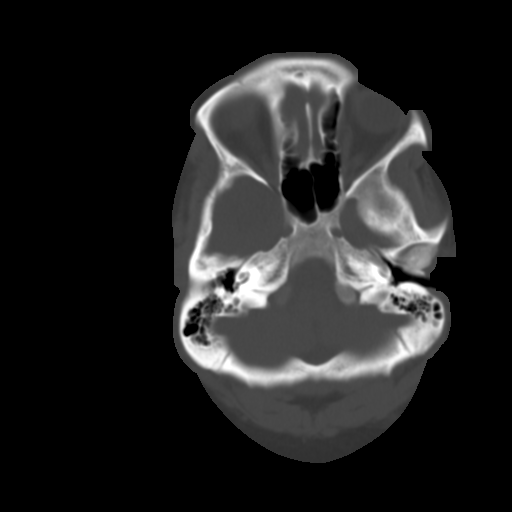
[im 6/30  brain]
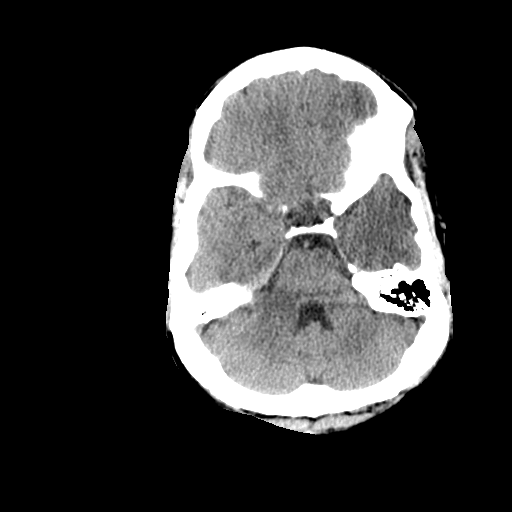
[im 9/30  brain]
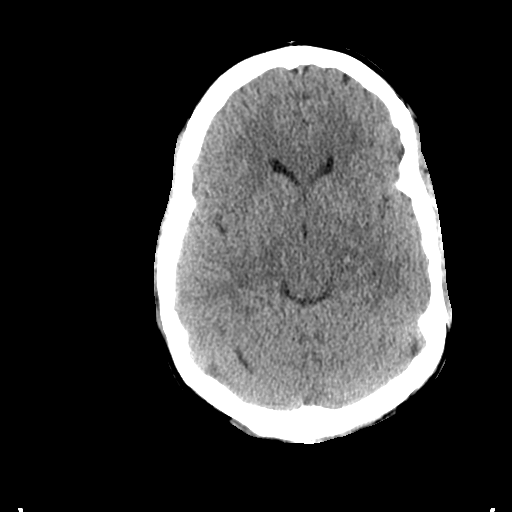
[im 12/30  brain]
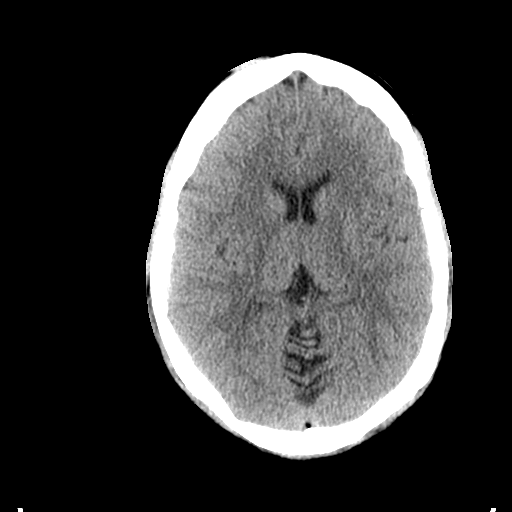
[im 16/30  brain]
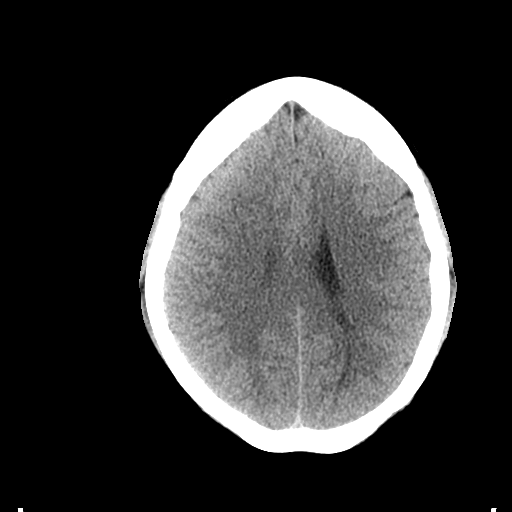
[im 16/30  bone]
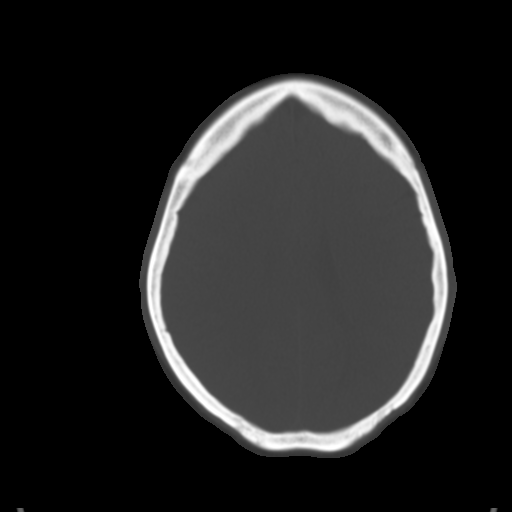
[im 19/30  brain]
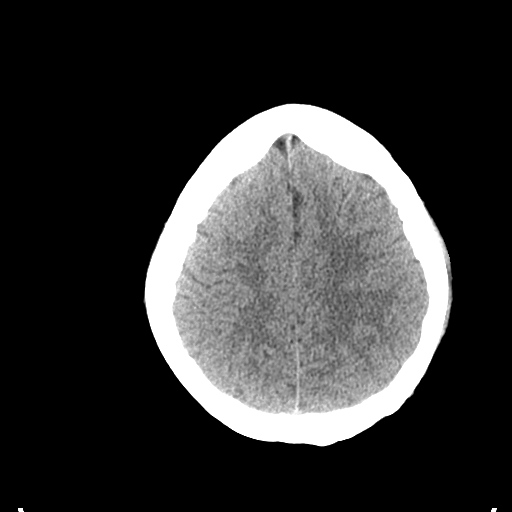
[im 22/30  brain]
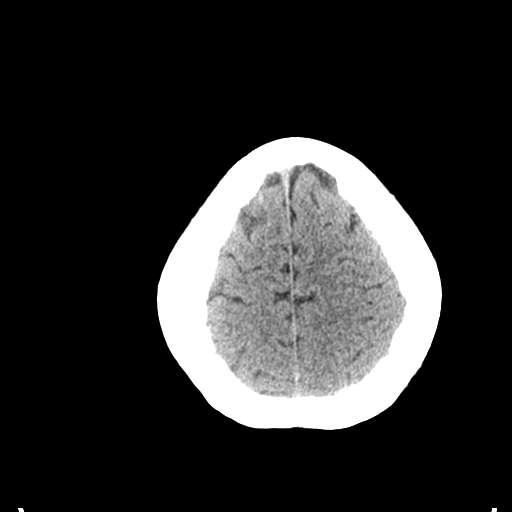
[im 25/30  brain]
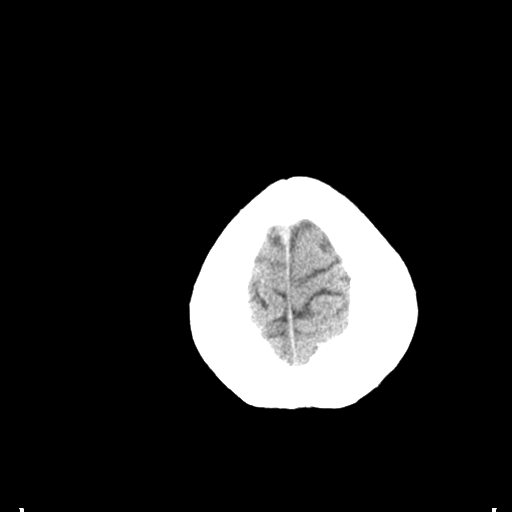
[im 28/30  brain]
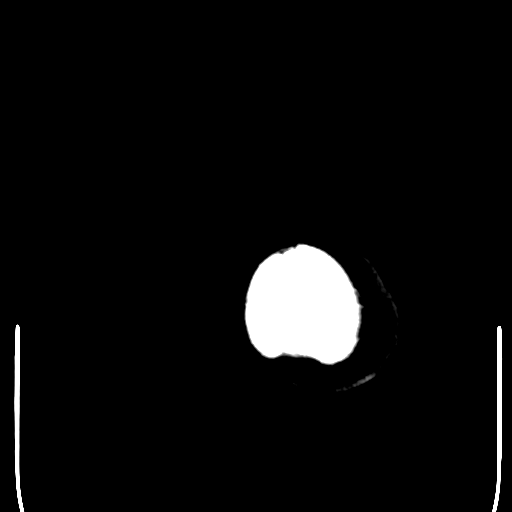
[im 28/30  bone]
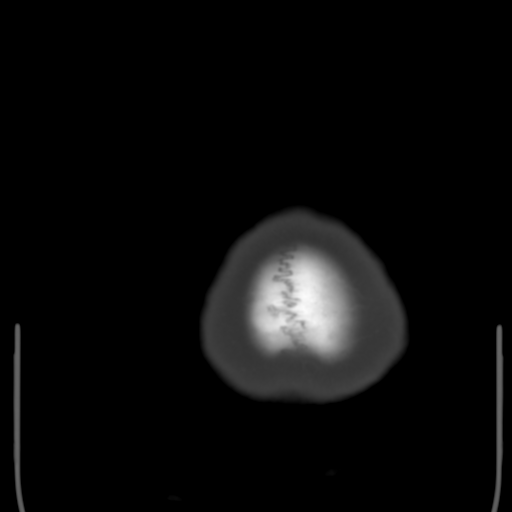

[Series 4: coronal soft · coronal · 0.32mm/px · 3 of 70 slices shown]
[im 24/70  brain]
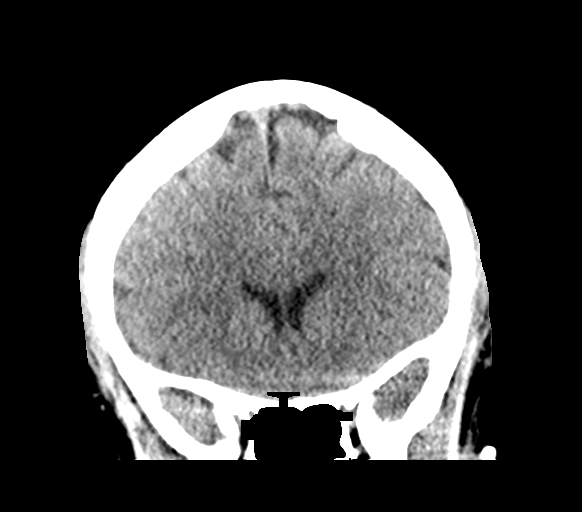
[im 31/70  brain]
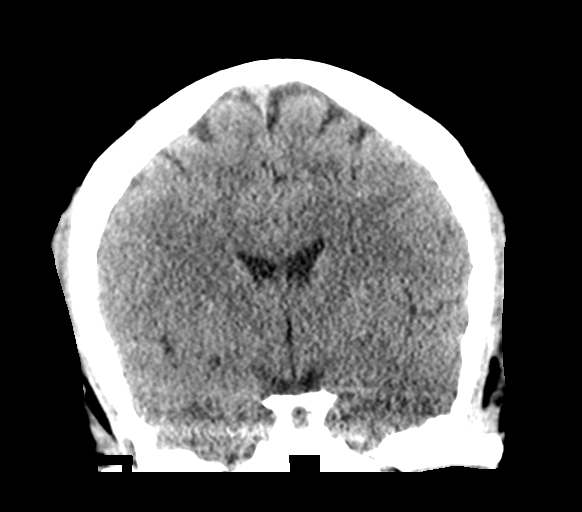
[im 39/70  brain]
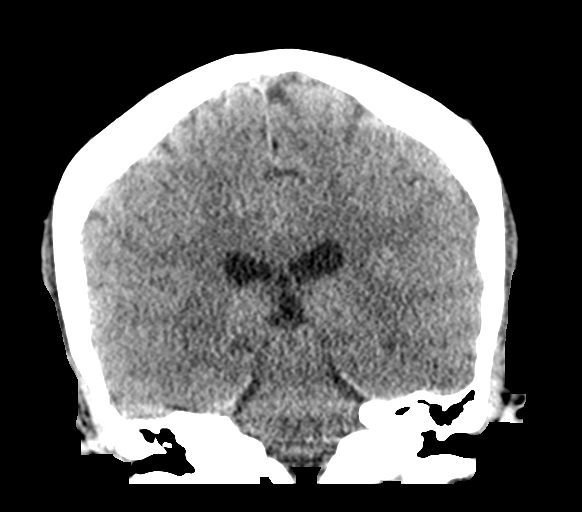

[Series 5: sagittal soft · sagittal · 0.34mm/px · 3 of 58 slices shown]
[im 20/58  brain]
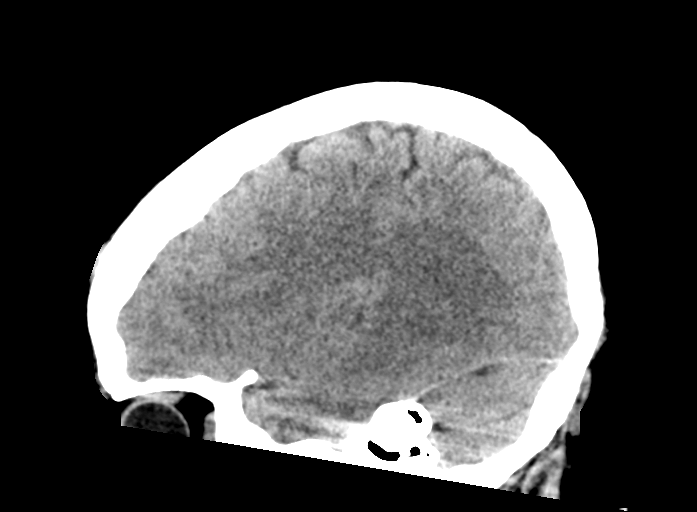
[im 29/58  brain]
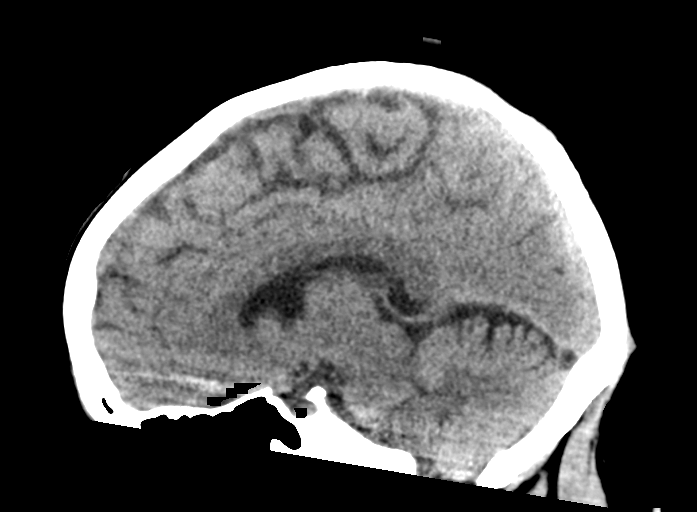
[im 39/58  brain]
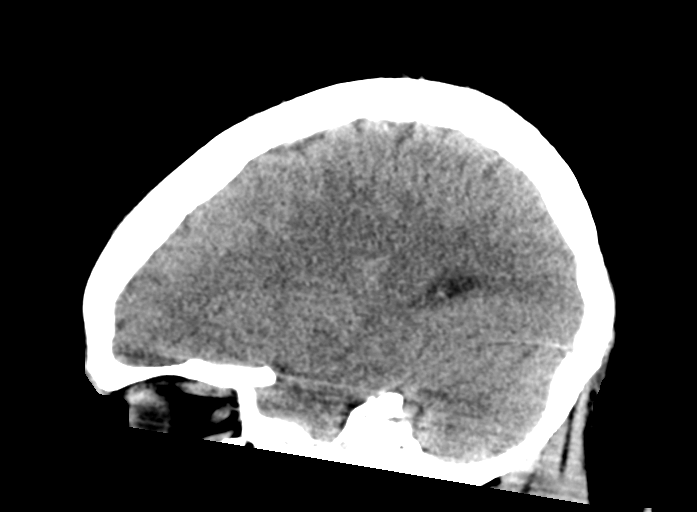

[15 of 47 positions shown; findings below may reference images not displayed]

FINDINGS: Brain: No evidence of acute infarction, hemorrhage, hydrocephalus,
extra-axial collection or mass lesion/mass effect. Morphologically
normal configuration of the mesial temporal lobes

Vascular: No hyperdense vessel or unexpected calcification.

Skull: Small amount of right frontal scalp swelling and trace 2 mm
subgaleal hematoma. No subcutaneous gas or foreign body. No
subjacent calvarial fracture. No suspicious osseous lesions.

Sinuses/Orbits: Paranasal sinuses and mastoid air cells are
predominantly clear. Dysconjugate gaze. Included portions of the
orbits are otherwise unremarkable.

Other: None
IMPRESSION: 1. No acute intracranial abnormality.
2. Dysconjugate gaze, correlate with visual inspection.
3. Small amount of right frontal scalp swelling and trace 2 mm
subgaleal hematoma. No subjacent calvarial fracture.

## 2020-03-19 NOTE — Telephone Encounter (Signed)
Jessica Hobbs came in stating that Jessica Hobbs is not taking her medication as prescribed, and she is having more and more seizures.  Jessica Hobbs is stating that she thinks that it is her Mental status,and that they have not heard from the Psychologist.   She wants you to reach out to her

## 2020-03-22 NOTE — Telephone Encounter (Signed)
Do you see where a psych referral was entered?

## 2020-03-22 NOTE — Telephone Encounter (Signed)
Please look at the referral and see what might have happened with that.

## 2020-03-27 ENCOUNTER — Encounter: Payer: Self-pay | Admitting: Family Medicine

## 2020-03-27 ENCOUNTER — Ambulatory Visit (INDEPENDENT_AMBULATORY_CARE_PROVIDER_SITE_OTHER): Payer: Medicaid Other | Admitting: Family Medicine

## 2020-03-27 ENCOUNTER — Other Ambulatory Visit: Payer: Self-pay

## 2020-03-27 VITALS — BP 112/64 | HR 96 | Temp 97.5°F | Resp 15 | Ht 66.0 in | Wt 238.0 lb

## 2020-03-27 DIAGNOSIS — F4310 Post-traumatic stress disorder, unspecified: Secondary | ICD-10-CM | POA: Diagnosis not present

## 2020-03-27 DIAGNOSIS — F319 Bipolar disorder, unspecified: Secondary | ICD-10-CM

## 2020-03-27 DIAGNOSIS — R569 Unspecified convulsions: Secondary | ICD-10-CM | POA: Diagnosis not present

## 2020-03-27 MED ORDER — LAMOTRIGINE 200 MG PO TABS: 200.0000 mg | ORAL_TABLET | Freq: Two times a day (BID) | ORAL | 0 refills | Status: DC

## 2020-03-27 MED ORDER — MELATONIN 5 MG PO TABS: 5.0000 mg | ORAL_TABLET | Freq: Every day | ORAL | 1 refills | Status: AC

## 2020-03-27 NOTE — Assessment & Plan Note (Signed)
Needs referral to neurologist again.  Would like to be with a female doctor. Referral is being worked on today in the office.  As it appears that the office tried to contact them without much success.  She is on Keppra and Lamictal and will be continued on this until she can be seen by the neurologist.  I have strongly recommended her making sure that she uses alarms to help set up her timeframes or to adjust her times that she takes the medications to be within her 12 to 18-hour awake time frame.
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
## 2020-03-27 NOTE — Patient Instructions (Addendum)
I appreciate the opportunity to provide you with care for your health and wellness. Today we discussed:  Seizures   Follow up: 3 months  No labs today   Referrals today for neurology and behavioral health   Maybe adjust timeframe of medications to be taken during awake hours at least 12 hours apart. So if awake by 11am, take medication then and then take at 11pm at night. See what can work so you can get the medication in your system.   Please continue to practice social distancing to keep you, your family, and our community safe.  If you must go out, please wear a mask and practice good handwashing.  It was a pleasure to see you and I look forward to continuing to work together on your health and well-being. Please do not hesitate to call the office if you need care or have questions about your care.  Have a wonderful day and week. With Gratitude, Tereasa Coop, DNP, AGNP-BC
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
## 2020-03-27 NOTE — Assessment & Plan Note (Signed)
Working on Set designer with female provider due to PTSD, bipolar 1 disorder, questionable borderline personality disorder as she was acting like a child this last week secondary to missing some seizure medication.  Unsure if this is ever been diagnosed she reports that she has not had it diagnosed.  She also has a lot of aggression type disposition with her communication but is definitely much more open and communicative than she was at her first visit.  Comfort level I believe plays a big role in this and hopefully therapy can help.

## 2020-03-29 ENCOUNTER — Encounter: Payer: Self-pay | Admitting: Neurology

## 2020-03-29 NOTE — Telephone Encounter (Signed)
I did not see where this was entered so I have sent this to Dr Vanetta Shawl

## 2020-04-03 ENCOUNTER — Ambulatory Visit (INDEPENDENT_AMBULATORY_CARE_PROVIDER_SITE_OTHER): Payer: Medicaid Other | Admitting: Neurology

## 2020-04-03 ENCOUNTER — Other Ambulatory Visit: Payer: Self-pay

## 2020-04-03 ENCOUNTER — Encounter: Payer: Self-pay | Admitting: Neurology

## 2020-04-03 VITALS — BP 134/83 | HR 99 | Resp 20 | Wt 241.0 lb

## 2020-04-03 DIAGNOSIS — R400 Somnolence: Secondary | ICD-10-CM

## 2020-04-03 DIAGNOSIS — G40009 Localization-related (focal) (partial) idiopathic epilepsy and epileptic syndromes with seizures of localized onset, not intractable, without status epilepticus: Secondary | ICD-10-CM | POA: Diagnosis not present

## 2020-04-03 DIAGNOSIS — R569 Unspecified convulsions: Secondary | ICD-10-CM

## 2020-04-03 MED ORDER — LAMOTRIGINE 200 MG PO TABS: 200.0000 mg | ORAL_TABLET | Freq: Two times a day (BID) | ORAL | 11 refills | Status: DC

## 2020-04-03 MED ORDER — LEVETIRACETAM ER 500 MG PO TB24
ORAL_TABLET | ORAL | 11 refills | Status: DC
Start: 2020-04-03 — End: 2020-07-11
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
## 2020-04-11 ENCOUNTER — Ambulatory Visit (INDEPENDENT_AMBULATORY_CARE_PROVIDER_SITE_OTHER): Payer: Medicaid Other | Admitting: Licensed Clinical Social Worker

## 2020-04-11 DIAGNOSIS — F4310 Post-traumatic stress disorder, unspecified: Secondary | ICD-10-CM | POA: Diagnosis not present

## 2020-04-11 DIAGNOSIS — F3130 Bipolar disorder, current episode depressed, mild or moderate severity, unspecified: Secondary | ICD-10-CM
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
## 2020-04-25 ENCOUNTER — Telehealth: Payer: Self-pay

## 2020-04-25 ENCOUNTER — Other Ambulatory Visit: Payer: Medicaid Other

## 2020-04-25 ENCOUNTER — Other Ambulatory Visit: Payer: Self-pay

## 2020-04-25 DIAGNOSIS — R400 Somnolence: Secondary | ICD-10-CM

## 2020-04-25 DIAGNOSIS — G40009 Localization-related (focal) (partial) idiopathic epilepsy and epileptic syndromes with seizures of localized onset, not intractable, without status epilepticus: Secondary | ICD-10-CM

## 2020-04-25 NOTE — Telephone Encounter (Signed)
Order placed

## 2020-04-25 NOTE — Telephone Encounter (Signed)
It's done at [facility], pls order Split night study and put on notes (with seizure montage). Thanks!!

## 2020-04-25 NOTE — Telephone Encounter (Signed)
Received voicemail from the Sleep Center at [facility], in regards to this patient. The woman on the phone states there was an order put in for this patient to have a home sleep study. The sleep tech reviewed the patients office notes and the patient must complete a in lab split night with seizure montage due to the patient having a history of seizures. If you have any questions please call back at [phone].

## 2020-04-27 ENCOUNTER — Other Ambulatory Visit: Payer: Self-pay

## 2020-04-27 ENCOUNTER — Ambulatory Visit: Payer: Medicaid Other | Attending: Family Medicine | Admitting: Internal Medicine

## 2020-04-30 ENCOUNTER — Other Ambulatory Visit (HOSPITAL_BASED_OUTPATIENT_CLINIC_OR_DEPARTMENT_OTHER): Payer: Self-pay

## 2020-04-30 ENCOUNTER — Ambulatory Visit (INDEPENDENT_AMBULATORY_CARE_PROVIDER_SITE_OTHER): Payer: Medicaid Other | Admitting: Neurology

## 2020-04-30 ENCOUNTER — Other Ambulatory Visit: Payer: Self-pay

## 2020-04-30 DIAGNOSIS — R569 Unspecified convulsions: Secondary | ICD-10-CM

## 2020-04-30 DIAGNOSIS — G40009 Localization-related (focal) (partial) idiopathic epilepsy and epileptic syndromes with seizures of localized onset, not intractable, without status epilepticus: Secondary | ICD-10-CM

## 2020-04-30 DIAGNOSIS — R400 Somnolence: Secondary | ICD-10-CM

## 2020-05-01 ENCOUNTER — Encounter: Payer: Self-pay | Admitting: Family Medicine

## 2020-05-01 ENCOUNTER — Telehealth: Payer: Self-pay | Admitting: Obstetrics & Gynecology

## 2020-05-01 ENCOUNTER — Telehealth (HOSPITAL_COMMUNITY): Payer: Self-pay | Admitting: Licensed Clinical Social Worker

## 2020-05-01 ENCOUNTER — Telehealth (HOSPITAL_COMMUNITY): Payer: Self-pay | Admitting: Psychiatry

## 2020-05-01 NOTE — Telephone Encounter (Signed)
Called patient to schedule therapy appt. asap due to urgent referral from [facility]. Called x2.

## 2020-05-01 NOTE — Telephone Encounter (Signed)
she had a cca done with [name] in may. The guardian/care giver/aunt is calling. Jessica Hobbs, we do have a DPR on her) she has concerns for her because she is afraid for the baby.  Jessica Hobbs does not care about the baby. has not bonded with the baby. aunt and mother is having to take care of the baby. and Jessica Hobbs is making "odd" or concering post on social media about the baby.  Patient needs a therapy apt ASAP, or please call patient or guardian with Recommendations.   CB# [phone]

## 2020-05-01 NOTE — Telephone Encounter (Signed)
Telephoned caregiver and advised would need to schedule appointment with PCP. Caregiver voiced understanding.

## 2020-05-01 NOTE — Telephone Encounter (Signed)
Pt caretaker who is listed on DPR form states that pt is having possible yeast infection also states that has increased in thirst, possible thrust and doesn't know if she should schedule a f/u apt here or with her pcp. Care taker concerned she may be possible diabetic would like A1C levels checked.

## 2020-05-02 ENCOUNTER — Ambulatory Visit (INDEPENDENT_AMBULATORY_CARE_PROVIDER_SITE_OTHER): Payer: Medicaid Other | Admitting: Clinical

## 2020-05-02 ENCOUNTER — Other Ambulatory Visit: Payer: Self-pay

## 2020-05-02 DIAGNOSIS — F4310 Post-traumatic stress disorder, unspecified: Secondary | ICD-10-CM

## 2020-05-02 DIAGNOSIS — F3130 Bipolar disorder, current episode depressed, mild or moderate severity, unspecified: Secondary | ICD-10-CM | POA: Diagnosis not present

## 2020-05-02 NOTE — Progress Notes (Signed)
Virtual Visit via Video Note  I connected with Jessica Hobbs on 05/02/20 at  3:00 PM EDT by a video enabled telemedicine application and verified that I am speaking with the correct person using two identifiers.  Location: Patient: Home Provider: Office   I discussed the limitations of evaluation and management by telemedicine and the availability of in person appointments. The patient expressed understanding and agreed to proceed.    THERAPIST PROGRESS NOTE  Session Time: 3:00PM-3:55PM  Participation Level: Active  Behavioral Response: CasualAlertDepressed  Type of Therapy: Individual Therapy  Treatment Goals addressed: Coping  Interventions: CBT, Motivational Interviewing and Supportive  Summary: Jessica Hobbs is a 29 y.o. female who presents with Depression and PTSD.The OPT therapist worked with thepatientfor herinitialOPT treatment. The OPT therapist utilized Motivational Interviewing to assist in creating therapeutic repore. The patient in the session was engaged and work in Tour manager about hertriggers and symptoms over the past few weeksincluding diffficulty in relocating to [place] from [place] and difficulty with having time lost and being away from her young child which she has internalized as problematic as she feels the child does not have same bond with her as the child does with the aunt the achild has been living with. The OPT therapist utilized Cognitive Behavioral Therapy through cognitive restructuring as well as worked with the patient on coping strategies to assist in management ofmood.The patient was tearful during parts of the session and the OPT therapist worked with the patient on getting out her emotions in safe way and not holding in her feelings. The patient verbalized when ask about medication management that she refuses to use medication to assist with mood management.  Suicidal/Homicidal:Nowithout intent/plan  Therapist Response:The OPT  therapist worked with the patient for the patients scheduled session. The patient was engaged in his session and gave feedback in relation to triggers, symptoms, and behavior responses over the pastfewweeks. The OPT therapist worked with the patient utilizing an in session Cognitive Behavioral Therapy exercise. The patient was responsive in the session and verbalized, " Iam willing to do what I need to do to be supportive of my child I just dont feel I should be responsible for fixing our bond when Its not my fault we dont have a bond".The patient verbalized safety for herself and noted she had no H/I towards anyone else in the home including the 31yr old. The OPT therapist will continue treatment work with the patient in hernext scheduled session.     Plan: Return again in 3/4 weeks.  Diagnosis: Axis I: Bipolar I disorder, most recent episode depressed and PTSD (post-traumatic stress disorder)    Axis II: No diagnosis    I discussed the assessment and treatment plan with the patient. The patient was provided an opportunity to ask questions and all were answered. The patient agreed with the plan and demonstrated an understanding of the instructions.   The patient was advised to call back or seek an in-person evaluation if the symptoms worsen or if the condition fails to improve as anticipated.  I provided 55 minutes of non-face-to-face time during this encounter.  Winfred Burn, LCSW 05/02/2020

## 2020-05-03 ENCOUNTER — Ambulatory Visit: Payer: Medicaid Other | Admitting: Family Medicine

## 2020-05-03 NOTE — Telephone Encounter (Signed)
Pt was given an apt with Suzan Garibaldi.  Nothing Further Needed at this time.

## 2020-05-04 ENCOUNTER — Other Ambulatory Visit (HOSPITAL_COMMUNITY)
Admission: RE | Admit: 2020-05-04 | Discharge: 2020-05-04 | Disposition: A | Discharge status: Home / Self Care | Payer: Medicaid Other | Source: Ambulatory Visit | Attending: Family Medicine | Admitting: Family Medicine

## 2020-05-04 ENCOUNTER — Other Ambulatory Visit: Payer: Self-pay

## 2020-05-04 ENCOUNTER — Encounter: Payer: Self-pay | Admitting: Family Medicine

## 2020-05-04 ENCOUNTER — Ambulatory Visit (INDEPENDENT_AMBULATORY_CARE_PROVIDER_SITE_OTHER): Payer: Medicaid Other | Admitting: Family Medicine

## 2020-05-04 VITALS — BP 122/78 | HR 87 | Temp 97.9°F | Ht 65.0 in | Wt 237.0 lb

## 2020-05-04 DIAGNOSIS — N760 Acute vaginitis: Secondary | ICD-10-CM

## 2020-05-04 DIAGNOSIS — B370 Candidal stomatitis: Secondary | ICD-10-CM | POA: Diagnosis not present

## 2020-05-04 LAB — POCT URINALYSIS DIP (CLINITEK)
Bilirubin, UA: NEGATIVE
Blood, UA: NEGATIVE
Glucose, UA: NEGATIVE mg/dL
Ketones, POC UA: NEGATIVE mg/dL
Leukocytes, UA: NEGATIVE
Nitrite, UA: NEGATIVE
POC PROTEIN,UA: NEGATIVE
Spec Grav, UA: 1.02 (ref 1.010–1.025)
Urobilinogen, UA: 0.2 E.U./dL
pH, UA: 5.5 (ref 5.0–8.0)

## 2020-05-04 MED ORDER — FLUCONAZOLE 150 MG PO TABS: 150.0000 mg | ORAL_TABLET | Freq: Every day | ORAL | 0 refills | Status: DC

## 2020-05-04 NOTE — Progress Notes (Signed)
Subjective:  Patient ID: Jessica Hobbs, female    DOB: Sep 02, 1991  Age: 29 y.o. MRN: [id number]  CC:  Chief Complaint  Patient presents with  . Vaginitis      HPI  HPI  Jessica Hobbs is a 29 year old patient of mine.  She presents today with yeast infection.  Reports that she tried home remedies and over-the-counter remedies without much success.  She has vaginal drainage that is white in nature and a little bit cottage cheeselike.  Mild itching but overall no pain.  Additionally reports that she feels like she has thrush on her tongue due to low white coating.  Denies having any new food changes or use of medications.  Denies having any recent intercourse.  Denies having any use of douches or any other items that might of changed level of the pH in the vaginal canal. Denies having any chest pain, shortness of breath, fever, chills, headache, vision changes, dizziness leg swelling or palpitations Reports taking all other medications without any issue.  Has no other concerns today.    Today patient denies signs and symptoms of COVID 19 infection including fever, chills, cough, shortness of breath, and headache. Past Medical, Surgical, Social History, Allergies, and Medications have been Reviewed.   Past Medical History:  Diagnosis Date  . Bipolar 1 disorder ([employer])   . Depression   . PTSD (post-traumatic stress disorder)   . Seizures ([facility])   . Sickle cell anemia ([facility])     Current Meds  Medication Sig  . Biotin 10 MG CAPS Take 1 capsule by mouth daily.   Marland Kitchen lamoTRIgine (LAMICTAL) 200 MG tablet Take 1 tablet (200 mg total) by mouth 2 (two) times daily.  Marland Kitchen levETIRAcetam (KEPPRA XR) 500 MG 24 hr tablet Take 4 tablets every night  . melatonin ([employer] MELATONIN) 5 MG TABS Take 1 tablet (5 mg total) by mouth at bedtime.    ROS:  Review of Systems  Constitutional: Negative.   HENT: Negative.   Eyes: Negative.   Respiratory: Negative.   Cardiovascular: Negative.   Gastrointestinal:  Negative.   Genitourinary: Negative.        See hpi   Musculoskeletal: Negative.   Skin: Negative.   Neurological: Negative.   Endo/Heme/Allergies: Negative.   Psychiatric/Behavioral: Negative.   All other systems reviewed and are negative.    Objective:   Today's Vitals: BP 122/78 (BP Location: Right Arm, Patient Position: Sitting, Cuff Size: Normal)   Pulse 87   Temp 97.9 F (36.6 C) (Temporal)   Ht 5\' 5"  (1.651 m)   Wt 237 lb (107.5 kg)   SpO2 93%   BMI 39.44 kg/m  Vitals with BMI 05/04/2020 04/03/2020 03/27/2020  Height 5\' 5"  - 5\' 6"   Weight 237 lbs 241 lbs 238 lbs  BMI 39.44 76.28 31.51  Systolic 761 607 371  Diastolic 78 83 64  Pulse 87 99 96     Physical Exam Vitals and nursing note reviewed.  Constitutional:      Appearance: Normal appearance. She is well-developed and well-groomed. She is obese.  HENT:     Head: Normocephalic and atraumatic.     Right Ear: External ear normal.     Left Ear: External ear normal.     Mouth/Throat:     Pharynx: Oropharynx is clear. Uvula midline.     Comments: Tongue is coated white/thrush Eyes:     General:        Right eye: No discharge.  Left eye: No discharge.     Conjunctiva/sclera: Conjunctivae normal.  Cardiovascular:     Rate and Rhythm: Normal rate and regular rhythm.     Pulses: Normal pulses.     Heart sounds: Normal heart sounds.  Pulmonary:     Effort: Pulmonary effort is normal.     Breath sounds: Normal breath sounds.  Musculoskeletal:        General: Normal range of motion.     Cervical back: Normal range of motion and neck supple.  Skin:    General: Skin is warm.  Neurological:     General: No focal deficit present.     Mental Status: She is alert and oriented to person, place, and time.  Psychiatric:        Attention and Perception: Attention normal.        Mood and Affect: Mood normal.        Speech: Speech normal.        Behavior: Behavior normal. Behavior is cooperative.        Thought  Content: Thought content normal.        Cognition and Memory: Cognition normal.        Judgment: Judgment normal.     Comments: Good eye contact, good communication      Assessment   1. Acute vaginitis   2. Oral thrush     Tests ordered Orders Placed This Encounter  Procedures  . POCT URINALYSIS DIP (CLINITEK)     Plan: Please see assessment and plan per problem list above.   Meds ordered this encounter  Medications  . fluconazole (DIFLUCAN) 150 MG tablet    Sig: Take 1 tablet (150 mg total) by mouth daily. Repeat in 3 days if not better.    Dispense:  2 tablet    Refill:  0    Order Specific Question:   Supervising Provider    Answer:   Genia Harold    Patient to follow-up in 06/27/2020   Freddy Finner, NP

## 2020-05-04 NOTE — Patient Instructions (Signed)
I appreciate the opportunity to provide you with care for your health and wellness. Today we discussed: yeast infection  Follow up: 06/27/2020 as scheduled  No labs or referrals today  I have sent in a medication to help. We will send out urine and let you know if any other treatment is needed.  Please continue to practice social distancing to keep you, your family, and our community safe.  If you must go out, please wear a mask and practice good handwashing.  It was a pleasure to see you and I look forward to continuing to work together on your health and well-being. Please do not hesitate to call the office if you need care or have questions about your care.  Have a wonderful day and week. With Gratitude, Tereasa Coop, DNP, AGNP-BC

## 2020-05-07 NOTE — Procedures (Signed)
ELECTROENCEPHALOGRAM REPORT  Date of Study: 04/30/2020  Patient's Name: Jessica Hobbs MRN: [id number] Date of Birth: Apr 07, 1991  Referring Provider: Dr. Patrcia Dolly  Clinical History: This is a 29 year old woman with recurrent seizures consisting of behavioral arrest, recently convulsions followed by combative behavior.3  Medications: Keppra, Lamictal  Technical Summary: A multichannel digital 1-hour EEG recording measured by the international 10-20 system with electrodes applied with paste and impedances below 5000 ohms performed in our laboratory with EKG monitoring in an awake and asleep patient.  Hyperventilation was not performed. Photic stimulation was performed.  The digital EEG was referentially recorded, reformatted, and digitally filtered in a variety of bipolar and referential montages for optimal display.    Description: The patient is awake and asleep during the recording.  During maximal wakefulness, there is a symmetric, medium voltage 10 Hz posterior dominant rhythm that attenuates with eye opening.  There is occasional focal theta and delta slowing over the left temporal region.  During drowsiness and sleep, there is an increase in theta slowing of the background.  Vertex waves and symmetric sleep spindles were seen.  Photic stimulation did not elicit any abnormalities.  There were frequent left frontotemporal sharp waves and rare independent right frontotemporal sharp waves seen. No electrographic seizures seen.    EKG lead was unremarkable.  Impression: This 1-hour awake and asleep EEG is abnormal due to the presence of: 1. Occasional focal slowing over the left temporal region 2. Frequent left frontotemporal epileptiform discharges 3. Rare independent right frontotemporal epileptiform discharges  Clinical Correlation of the above findings indicates focal cerebral dysfunction over the left temporal region suggestive of underlying structural or physiologic abnormality.  There is a tendency for seizures to arise from the bilateral frontotemporal regions. Clinical correlation is advised.   Patrcia Dolly, M.D.

## 2020-05-10 DIAGNOSIS — B370 Candidal stomatitis: Secondary | ICD-10-CM | POA: Insufficient documentation

## 2020-05-10 DIAGNOSIS — N760 Acute vaginitis: Secondary | ICD-10-CM | POA: Insufficient documentation

## 2020-05-10 NOTE — Assessment & Plan Note (Signed)
Diflucan provided.  If not better advised to call back.

## 2020-05-10 NOTE — Assessment & Plan Note (Addendum)
oral treatment.  If not better we will do nystatin swish

## 2020-05-11 ENCOUNTER — Other Ambulatory Visit (HOSPITAL_COMMUNITY)
Admission: RE | Admit: 2020-05-11 | Discharge: 2020-05-11 | Disposition: A | Discharge status: Home / Self Care | Payer: Medicaid Other | Source: Ambulatory Visit | Attending: Neurology | Admitting: Neurology

## 2020-05-11 ENCOUNTER — Other Ambulatory Visit: Payer: Self-pay

## 2020-05-11 DIAGNOSIS — Z20822 Contact with and (suspected) exposure to covid-19: Secondary | ICD-10-CM | POA: Insufficient documentation

## 2020-05-11 DIAGNOSIS — Z01812 Encounter for preprocedural laboratory examination: Secondary | ICD-10-CM | POA: Diagnosis not present

## 2020-05-12 LAB — SARS CORONAVIRUS 2 (TAT 6-24 HRS): SARS Coronavirus 2: NEGATIVE

## 2020-05-14 LAB — URINE CYTOLOGY ANCILLARY ONLY
Bacterial Vaginitis-Urine: NEGATIVE
Candida Urine: NEGATIVE

## 2020-05-15 ENCOUNTER — Other Ambulatory Visit: Payer: Self-pay

## 2020-05-15 ENCOUNTER — Ambulatory Visit (HOSPITAL_BASED_OUTPATIENT_CLINIC_OR_DEPARTMENT_OTHER): Payer: Medicaid Other | Attending: Neurology | Admitting: Internal Medicine

## 2020-05-15 VITALS — Ht 65.0 in | Wt 237.0 lb

## 2020-05-15 DIAGNOSIS — Z79899 Other long term (current) drug therapy: Secondary | ICD-10-CM | POA: Diagnosis not present

## 2020-05-15 DIAGNOSIS — G40009 Localization-related (focal) (partial) idiopathic epilepsy and epileptic syndromes with seizures of localized onset, not intractable, without status epilepticus: Secondary | ICD-10-CM | POA: Diagnosis not present

## 2020-05-15 DIAGNOSIS — R400 Somnolence: Secondary | ICD-10-CM

## 2020-05-20 DIAGNOSIS — R400 Somnolence: Secondary | ICD-10-CM

## 2020-05-20 NOTE — Procedures (Signed)
    Patient Name: Jessica Hobbs, Jessica Hobbs Date: 05/15/2020 Gender: Female D.O.B: [birth year] Age (years): 28 Referring Provider: Van Clines Height (inches): 65 Interpreting Physician: Jetty Duhamel MD, ABSM Weight (lbs): 237 RPSGT: Armen Pickup BMI: 39 MRN: [id number] Neck Size: 14.50  CLINICAL INFORMATION Sleep Study Type: NPSG Indication for sleep study: Fatigue Epworth Sleepiness Score: 8  SLEEP STUDY TECHNIQUE As per the AASM Manual for the Scoring of Sleep and Associated Events v2.3 (April 2016) with a hypopnea requiring 4% desaturations.  The channels recorded and monitored were frontal, central and occipital EEG, electrooculogram (EOG), submentalis EMG (chin), nasal and oral airflow, thoracic and abdominal wall motion, anterior tibialis EMG, snore microphone, electrocardiogram, and pulse oximetry.  MEDICATIONS Medications self-administered by patient taken the night of the study : KEPPRA XR, MELATONIN  SLEEP ARCHITECTURE The study was initiated at 10:36:12 PM and ended at 5:11:07 AM.  Sleep onset time was 103.2 minutes and the sleep efficiency was 72.0%%. The total sleep time was 284.5 minutes.  Stage REM latency was 64.0 minutes.  The patient spent 1.9%% of the night in stage N1 sleep, 69.9%% in stage N2 sleep, 2.3%% in stage N3 and 25.8% in REM.  Alpha intrusion was absent.  Supine sleep was 18.98%.  RESPIRATORY PARAMETERS The overall apnea/hypopnea index (AHI) was 0.8 per hour. There were 0 total apneas, including 0 obstructive, 0 central and 0 mixed apneas. There were 4 hypopneas and 3 RERAs.  The AHI during Stage REM sleep was 1.6 per hour.  AHI while supine was 2.2 per hour.  The mean oxygen saturation was 96.4%. The minimum SpO2 during sleep was 89.0%.  snoring was noted during this study.  CARDIAC DATA The 2 lead EKG demonstrated sinus rhythm. The mean heart rate was 72.1 beats per minute. Other EKG findings include: None.  LEG MOVEMENT DATA The  total PLMS were 0 with a resulting PLMS index of 0.0. Associated arousal with leg movement index was 0.0 .  IMPRESSIONS - No significant obstructive sleep apnea occurred during this study (AHI = 0.8/h). - No significant central sleep apnea occurred during this study (CAI = 0.0/h). - The patient had minimal or no oxygen desaturation during the study (Min O2 = 89.0%) Mean O2 sat 96.7%. - No snoring was audible during this study. - No cardiac abnormalities were noted during this study. - Seizure montage applied with no seizure activity identified. - Clinically significant periodic limb movements did not occur during sleep. No significant associated arousals.  DIAGNOSIS - Normal Study  RECOMMENDATIONS - Manage for symptoms based on clinical judgment. - Sleep hygiene should be reviewed to assess factors that may improve sleep quality. - Weight management and regular exercise should be initiated or continued if appropriate.  [Electronically signed] 05/20/2020 11:26 AM  Jetty Duhamel MD, ABSM Diplomate, American Board of Sleep Medicine   NPI: [id number]                         Jetty Duhamel Diplomate, American Board of Sleep Medicine  ELECTRONICALLY SIGNED ON:  05/20/2020, 11:23 AM  [facility] PH: [phone]   FX: [phone] ACCREDITED BY THE AMERICAN ACADEMY OF SLEEP MEDICINE

## 2020-06-05 ENCOUNTER — Encounter: Payer: Self-pay | Admitting: Family Medicine

## 2020-06-07 ENCOUNTER — Other Ambulatory Visit (HOSPITAL_COMMUNITY)
Admission: RE | Admit: 2020-06-07 | Discharge: 2020-06-07 | Disposition: A | Discharge status: Home / Self Care | Payer: Medicaid Other | Source: Ambulatory Visit | Attending: Family Medicine | Admitting: Family Medicine

## 2020-06-07 ENCOUNTER — Encounter: Payer: Self-pay | Admitting: Family Medicine

## 2020-06-07 ENCOUNTER — Ambulatory Visit: Payer: Medicaid Other | Admitting: Family Medicine

## 2020-06-07 ENCOUNTER — Other Ambulatory Visit: Payer: Self-pay

## 2020-06-07 VITALS — BP 118/78 | HR 91 | Temp 97.2°F | Resp 18 | Ht 65.0 in | Wt 233.0 lb

## 2020-06-07 DIAGNOSIS — N760 Acute vaginitis: Secondary | ICD-10-CM | POA: Diagnosis not present

## 2020-06-07 DIAGNOSIS — N3000 Acute cystitis without hematuria: Secondary | ICD-10-CM

## 2020-06-07 LAB — POCT URINALYSIS DIP (CLINITEK)
Bilirubin, UA: NEGATIVE
Blood, UA: NEGATIVE
Glucose, UA: NEGATIVE mg/dL
Ketones, POC UA: NEGATIVE mg/dL
Nitrite, UA: NEGATIVE
POC PROTEIN,UA: NEGATIVE
Spec Grav, UA: 1.02 (ref 1.010–1.025)
Urobilinogen, UA: 0.2 E.U./dL
pH, UA: 5.5 (ref 5.0–8.0)

## 2020-06-07 MED ORDER — SULFAMETHOXAZOLE-TRIMETHOPRIM 800-160 MG PO TABS: 1.0000 | ORAL_TABLET | Freq: Two times a day (BID) | ORAL | 0 refills | Status: AC

## 2020-06-07 NOTE — Patient Instructions (Addendum)
I appreciate the opportunity to provide you with care for your health and wellness. Today we discussed: UTI  Follow up: 06/27/2020 as scheduled  No labs or referrals today  Please take medication to help clear up UTI. Drink plenty of water.  Continue all other medications as ordered.  Make sure you have a follow up with the Neurologist for recent Seizures.  Please continue to practice social distancing to keep you, your family, and our community safe.  If you must go out, please wear a mask and practice good handwashing.  It was a pleasure to see you and I look forward to continuing to work together on your health and well-being. Please do not hesitate to call the office if you need care or have questions about your care.  Have a wonderful day and week. With Gratitude, Tereasa Coop, DNP, AGNP-BC

## 2020-06-07 NOTE — Progress Notes (Signed)
Subjective:  Patient ID: Jessica Hobbs, female    DOB: [birth year]  Age: 29 y.o. MRN: [id number]  CC:  Chief Complaint  Patient presents with  . Urinary Tract Infection      HPI  HPI   [name] is a 29 year old female patient of mine.  She presents today with questionable urinary tract symptoms.  In addition her cousin called and involved that she was not taking her Lamictal as ordered.  As she had had a seizure.  She reports here in the office that she is taking her Lamictal.  And she thinks that she was having seizures because she had had a lot going on recently.  She reports that she is sleeping good.  She is eating good.  She is drinking water.  She reports her son is doing well he does had a recent dedication.  She has not followed up with neurology.  Urinary tract symptoms started around July 7.  She reports she thinks it might have been related to stuff she was eating.  She denies having any intercourse.  She denies having any back pain, fever, chills, upset stomach.  But does report some diarrhea that is recently started.  She reports burning with urination, itchiness as well.  Today patient denies signs and symptoms of COVID 19 infection including fever, chills, cough, shortness of breath, and headache. Past Medical, Surgical, Social History, Allergies, and Medications have been Reviewed.   Past Medical History:  Diagnosis Date  . Bipolar 1 disorder ([facility])   . Depression   . PTSD (post-traumatic stress disorder)   . Seizures ([facility])   . Sickle cell anemia ([facility])     Current Meds  Medication Sig  . Biotin 10 MG CAPS Take 1 capsule by mouth daily.   Marland Kitchen lamoTRIgine (LAMICTAL) 200 MG tablet Take 1 tablet (200 mg total) by mouth 2 (two) times daily.  Marland Kitchen levETIRAcetam (KEPPRA XR) 500 MG 24 hr tablet Take 4 tablets every night  . melatonin ([employer] MELATONIN) 5 MG TABS Take 1 tablet (5 mg total) by mouth at bedtime.    ROS:  Review of Systems  HENT: Negative.   Eyes:  Negative.   Respiratory: Negative.   Cardiovascular: Negative.   Gastrointestinal: Negative.   Genitourinary: Positive for dysuria and frequency. Negative for flank pain and urgency.  Musculoskeletal: Negative.   Skin: Negative.   Neurological: Negative.   Endo/Heme/Allergies: Negative.   Psychiatric/Behavioral: Negative.   All other systems reviewed and are negative.    Objective:   Today's Vitals: BP 118/78 (BP Location: Right Arm, Patient Position: Sitting, Cuff Size: Normal)   Pulse 91   Temp (!) 97.2 F (36.2 C) (Temporal)   Resp 18   Ht 5\' 5"  (1.651 m)   Wt 233 lb (105.7 kg)   LMP 05/21/2020 (Exact Date)   SpO2 97%   BMI 38.77 kg/m  Vitals with BMI 06/07/2020 05/15/2020 05/04/2020  Height 5\' 5"  5\' 5"  5\' 5"   Weight 233 lbs 237 lbs 237 lbs  BMI 38.77 39.44 39.44  Systolic 118 - 122  Diastolic 78 - 78  Pulse 91 - 87     Physical Exam Vitals and nursing note reviewed.  Constitutional:      Appearance: Normal appearance. She is well-developed and well-groomed. She is morbidly obese.  HENT:     Head: Normocephalic and atraumatic.     Right Ear: External ear normal.     Left Ear: External ear normal.  Mouth/Throat:     Comments: Mask in place Eyes:     General:        Right eye: No discharge.        Left eye: No discharge.     Conjunctiva/sclera: Conjunctivae normal.  Cardiovascular:     Rate and Rhythm: Normal rate and regular rhythm.     Pulses: Normal pulses.     Heart sounds: Normal heart sounds.  Pulmonary:     Effort: Pulmonary effort is normal.     Breath sounds: Normal breath sounds.  Musculoskeletal:        General: Normal range of motion.     Cervical back: Normal range of motion and neck supple.  Skin:    General: Skin is warm.  Neurological:     General: No focal deficit present.     Mental Status: She is alert and oriented to person, place, and time.  Psychiatric:        Attention and Perception: Attention normal.        Mood and Affect:  Mood and affect normal.        Speech: Speech normal.        Behavior: Behavior normal. Behavior is cooperative.        Thought Content: Thought content normal.      Assessment   1. Acute vaginitis   2. Acute cystitis without hematuria     Tests ordered Orders Placed This Encounter  Procedures  . Urine Culture  . POCT URINALYSIS DIP (CLINITEK)     Plan: Please see assessment and plan per problem list above.   Meds ordered this encounter  Medications  . sulfamethoxazole-trimethoprim (BACTRIM DS) 800-160 MG tablet    Sig: Take 1 tablet by mouth 2 (two) times daily for 3 days.    Dispense:  6 tablet    Refill:  0    Order Specific Question:   Supervising Provider    Answer:   Genia Harold    Patient to follow-up in 06/27/2020   Freddy Finner, NP

## 2020-06-08 DIAGNOSIS — N3000 Acute cystitis without hematuria: Secondary | ICD-10-CM | POA: Insufficient documentation

## 2020-06-08 LAB — URINE CYTOLOGY ANCILLARY ONLY
Bacterial Vaginitis-Urine: NEGATIVE
Candida Urine: NEGATIVE

## 2020-06-08 NOTE — Assessment & Plan Note (Signed)
We will send out urine culture for cytology to see if yeast is present.  Will treat as appropriate.

## 2020-06-08 NOTE — Assessment & Plan Note (Signed)
Positive leukocyte dip in office.  Sending out for cytology.  In addition to checking for bacteria also checking for yeast.  We will treat Bactrim at this time.  For UTI and will follow up if needed for yeast treatment.

## 2020-06-10 LAB — URINE CULTURE: Culture: >=100000 — AB

## 2020-06-11 ENCOUNTER — Ambulatory Visit (INDEPENDENT_AMBULATORY_CARE_PROVIDER_SITE_OTHER): Payer: Medicaid Other | Admitting: Clinical

## 2020-06-11 ENCOUNTER — Other Ambulatory Visit: Payer: Self-pay

## 2020-06-11 DIAGNOSIS — F4310 Post-traumatic stress disorder, unspecified: Secondary | ICD-10-CM

## 2020-06-11 DIAGNOSIS — F3130 Bipolar disorder, current episode depressed, mild or moderate severity, unspecified: Secondary | ICD-10-CM | POA: Diagnosis not present

## 2020-06-11 NOTE — Progress Notes (Signed)
°  Virtual Visit via Video Note  I connected withShaletia Hobbs on 06/11/20 at  3:00 PM EDT by a video enabled telemedicine application and verified that I am speaking with the correct person using two identifiers.  Location: Patient: Home Provider: Office  I discussed the limitations of evaluation and management by telemedicine and the availability of in person appointments. The patient expressed understanding and agreed to proceed.    THERAPIST PROGRESS NOTE  Session Time: 4:00PM-4:40PM  Participation Level: Active  Behavioral Response: CasualAlertDepressed  Type of Therapy: Individual Therapy  Treatment Goals addressed: Coping  Interventions: CBT, Motivational Interviewing and Supportive  Summary: Jessica Hobbs is a 29 y.o. female who presents with Depression and PTSD.The OPT therapist worked with thepatientfor herinitialOPT treatment. The OPT therapist utilized Motivational Interviewing to assist in creating therapeutic repore. The patient in the session was engaged and work in Tour manager about hertriggers and symptoms over the past few weeksincluding ongoing adjustment in relocating to [place] from [place] and ongoing adjustment to having time lost and being away from her young child. The paitient in the session was interacting with her child and working to establish a bond. The family account indicates ongoing improvement with the patient taking initiative and being involved with/ caretaking for her child with the families support. The OPT therapist utilized Cognitive Behavioral Therapy through cognitive restructuring as well as worked with the patient encouraging ongoing work with her child and increased socialization. The patient continued to verbalize when ask about medication management that she is not at this time considering this as a treatment option.  Suicidal/Homicidal:Nowithout intent/plan  Therapist Response:The OPT therapist worked with  the patient for the patients scheduled session. The patient presented for the session In a much improved mood. The patients family spoke to the patient being in a good mood however acknowledged that the patient has shown a tendency to seesaw.The patient was engaged in his session and gave feedback in relation to triggers, symptoms, and behavior responses over the pastfewweeks. The OPT therapist worked with the patient utilizing an in session Cognitive Behavioral Therapy exercise. The patient was responsive in the session and verbalized, " Fredia Beets been doing more and I have had instances where if the baby is crying at night I get up and check on the baby".The patient verbalized intent to continue caretaking and noted that she feels more comfortable with her living situation currently. The OPT therapist will continue treatment work with the patient in hernext scheduled session.     Plan: Return again in 3 weeks.  Diagnosis:      Axis I: Bipolar I disorder, most recent episode depressed and PTSD (post-traumatic stress disorder)                          Axis II: No diagnosis   I discussed the assessment and treatment plan with the patient. The patient was provided an opportunity to ask questions and all were answered. The patient agreed with the plan and demonstrated an understanding of the instructions.  The patient was advised to call back or seek an in-person evaluation if the symptoms worsen or if the condition fails to improve as anticipated.  I provided 40 minutes of non-face-to-face time during this encounter.  Winfred Burn, LCSW 06/11/2020

## 2020-06-27 ENCOUNTER — Ambulatory Visit: Payer: Medicaid Other | Admitting: Family Medicine

## 2020-07-11 ENCOUNTER — Other Ambulatory Visit: Payer: Self-pay

## 2020-07-11 ENCOUNTER — Ambulatory Visit (INDEPENDENT_AMBULATORY_CARE_PROVIDER_SITE_OTHER): Payer: Medicaid Other | Admitting: Neurology

## 2020-07-11 ENCOUNTER — Encounter: Payer: Self-pay | Admitting: Neurology

## 2020-07-11 VITALS — BP 108/58 | HR 72 | Ht 65.0 in | Wt 240.0 lb

## 2020-07-11 DIAGNOSIS — G40009 Localization-related (focal) (partial) idiopathic epilepsy and epileptic syndromes with seizures of localized onset, not intractable, without status epilepticus: Secondary | ICD-10-CM | POA: Diagnosis not present

## 2020-07-11 DIAGNOSIS — R569 Unspecified convulsions: Secondary | ICD-10-CM

## 2020-07-11 MED ORDER — LAMOTRIGINE 200 MG PO TABS: 200.0000 mg | ORAL_TABLET | Freq: Two times a day (BID) | ORAL | 11 refills | Status: AC

## 2020-07-11 MED ORDER — LEVETIRACETAM ER 500 MG PO TB24: ORAL_TABLET | ORAL | 11 refills | Status: AC
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
## 2020-07-11 NOTE — Patient Instructions (Addendum)
1. Restart Lamotrigine 200mg  twice a day 2. Continue Keppra XR 500mg : Take 4 tablets every night 3. Continue seizure calendar 4. Follow-up in 4 months, call for any changes  If interested, there are some activities which have therapeutic value and can be useful in keeping you cognitively stimulated. You can try this website: https://www.barrowneuro.org/get-to-know-barrow/centers-programs/neurorehabilitation-center/neuro-rehab-apps-and-games/ which has options, categorized by level of difficulty.   Seizure Precautions: 1. If medication has been prescribed for you to prevent seizures, take it exactly as directed.  Do not stop taking the medicine without talking to your doctor first, even if you have not had a seizure in a [place] time.   2. Avoid activities in which a seizure would cause danger to yourself or to others.  Don't operate dangerous machinery, swim alone, or climb in high or dangerous places, such as on ladders, roofs, or girders.  Do not drive unless your doctor says you may.  3. If you have any warning that you may have a seizure, lay down in a safe place where you can't hurt yourself.    4.  No driving for 6 months from last seizure, as per [facility]   Please refer to the following link on the Epilepsy Foundation of America's website for more information: http://www.epilepsyfoundation.org/answerplace/Social/driving/drivingu.cfm   5.  Maintain good sleep hygiene.  6.  Notify your neurology if you are planning pregnancy or if you become pregnant.  7.  Contact your doctor if you have any problems that may be related to the medicine you are taking.  8.  Call 911 and bring the patient back to the ED if:        A.  The seizure lasts longer than 5 minutes.       B.  The patient doesn't awaken shortly after the seizure  C.  The patient has new problems such as difficulty seeing, speaking or moving  D.  The patient was injured during the seizure  E.  The patient has a  temperature over 102 F (39C)  F.  The patient vomited and now is having trouble breathing

## 2020-07-13 ENCOUNTER — Other Ambulatory Visit: Payer: Self-pay

## 2020-07-13 DIAGNOSIS — G40909 Epilepsy, unspecified, not intractable, without status epilepticus: Secondary | ICD-10-CM | POA: Diagnosis not present

## 2020-07-13 DIAGNOSIS — E119 Type 2 diabetes mellitus without complications: Secondary | ICD-10-CM | POA: Insufficient documentation

## 2020-07-13 DIAGNOSIS — Y999 Unspecified external cause status: Secondary | ICD-10-CM | POA: Diagnosis not present

## 2020-07-13 DIAGNOSIS — X58XXXA Exposure to other specified factors, initial encounter: Secondary | ICD-10-CM | POA: Diagnosis not present

## 2020-07-13 DIAGNOSIS — R410 Disorientation, unspecified: Secondary | ICD-10-CM | POA: Diagnosis not present

## 2020-07-13 DIAGNOSIS — Y939 Activity, unspecified: Secondary | ICD-10-CM | POA: Diagnosis not present

## 2020-07-13 DIAGNOSIS — R569 Unspecified convulsions: Secondary | ICD-10-CM | POA: Diagnosis not present

## 2020-07-13 DIAGNOSIS — Y929 Unspecified place or not applicable: Secondary | ICD-10-CM | POA: Insufficient documentation

## 2020-07-13 DIAGNOSIS — Z79899 Other long term (current) drug therapy: Secondary | ICD-10-CM | POA: Diagnosis not present

## 2020-07-13 DIAGNOSIS — S00502A Unspecified superficial injury of oral cavity, initial encounter: Secondary | ICD-10-CM | POA: Diagnosis present

## 2020-07-13 DIAGNOSIS — S00532A Contusion of oral cavity, initial encounter: Secondary | ICD-10-CM | POA: Diagnosis not present

## 2020-07-13 DIAGNOSIS — I10 Essential (primary) hypertension: Secondary | ICD-10-CM | POA: Diagnosis not present

## 2020-07-14 ENCOUNTER — Emergency Department (HOSPITAL_COMMUNITY)
Admission: EM | Admit: 2020-07-14 | Discharge: 2020-07-14 | Disposition: A | Discharge status: Home / Self Care | Payer: Medicaid Other | Attending: Emergency Medicine | Admitting: Emergency Medicine

## 2020-07-14 ENCOUNTER — Encounter (HOSPITAL_COMMUNITY): Payer: Self-pay | Admitting: Emergency Medicine

## 2020-07-14 DIAGNOSIS — G40909 Epilepsy, unspecified, not intractable, without status epilepticus: Secondary | ICD-10-CM

## 2020-07-14 LAB — CBG MONITORING, ED: Glucose-Capillary: 92 mg/dL (ref 70–99)

## 2020-07-14 MED ORDER — LAMOTRIGINE 25 MG PO TABS
200.0000 mg | ORAL_TABLET | Freq: Once | ORAL | Status: AC
  Administered 2020-07-14: 200 mg via ORAL

## 2020-07-14 NOTE — Discharge Instructions (Addendum)
Go to the drugstore today to pick up your prescriptions.  Follow-up with your neurologist as needed.

## 2020-07-14 NOTE — ED Provider Notes (Signed)
[facility] EMERGENCY DEPARTMENT Provider Note   CSN: [id number] Arrival date & time: 07/13/20  2219   Time seen 4:05 AM  History Chief Complaint  Patient presents with  . Seizures    Jessica Hobbs is a 29 y.o. female.  HPI   Patient has a history of seizures for a [place] period of time.  On reviewing her neurologist note she was recently transferred to Dr. Karel Jarvis with [facility] neurology.  She was actually seen there on the 18th.  She did have a recent EEG and June which was abnormal with occasional left temporal slowing and frequent left frontotemporal epileptic form discharges and rare independent right frontotemporal epileptiform discharges.  Her sleep study was normal.  She had been switched from fast acting to extended release Keppra the visit before and she is supposed to be taking Lamictal 200 mg twice a day.  She states she has not had the Lamictal for "a good while which she states is 2 to 3 months.  However looking at her chart it looks like her neurologist did write for it on the 18th.  Patient told me she has seizures every couple months.  She states her last seizure was during the night of the 20th and she woke up with bruising on her tongue.  She states her family wanted her to get checked out tonight.  She denies any precipitating events.  She states her only injury today is the biting of her tongue with bruising.  PCP Freddy Finner, NP   Past Medical History:  Diagnosis Date  . Bipolar 1 disorder (HCC)   . Depression   . PTSD (post-traumatic stress disorder)   . Seizures (HCC)   . Sickle cell anemia [facility])     Patient Active Problem List   Diagnosis Date Noted  . Acute cystitis without hematuria 06/08/2020  . Acute vaginitis 05/10/2020  . Oral thrush 05/10/2020  . Seizures (HCC) 01/06/2020  . Morbid obesity (HCC) 01/06/2020  . Prediabetes 01/06/2020  . Bipolar 1 disorder (HCC) 01/06/2020  . Fatigue 01/06/2020  . PTSD (post-traumatic stress disorder) 01/06/2020    . Sickle cell trait (HCC) 01/06/2020    Past Surgical History:  Procedure Laterality Date  . CESAREAN SECTION       OB History    Gravida  1   Para      Term      Preterm      AB      Living  1     SAB      TAB      Ectopic      Multiple      Live Births  1           Family History  Problem Relation Age of Onset  . Alcohol abuse Father   . Drug abuse Father     Social History   Tobacco Use  . Smoking status: Never Smoker  . Smokeless tobacco: Never Used  Vaping Use  . Vaping Use: Never used  Substance Use Topics  . Alcohol use: Never  . Drug use: Never    Home Medications Prior to Admission medications   Medication Sig Start Date End Date Taking? Authorizing Provider  Biotin 10 MG CAPS Take 1 capsule by mouth daily.     [provider]  lamoTRIgine (LAMICTAL) 200 MG tablet Take 1 tablet (200 mg total) by mouth 2 (two) times daily. 07/11/20   Van Clines, MD  levETIRAcetam (KEPPRA XR) 500  MG 24 hr tablet Take 4 tablets every night 07/11/20   Van Clines, MD  melatonin ([facility] MELATONIN) 5 MG TABS Take 1 tablet (5 mg total) by mouth at bedtime. 03/27/20   Freddy Finner, NP  vitamin B-12 (CYANOCOBALAMIN) 1000 MCG tablet Take 1,000 mcg by mouth daily.    [provider]    Allergies    Soy allergy  Review of Systems   Review of Systems  All other systems reviewed and are negative.   Physical Exam Updated Vital Signs BP (!) 141/72 (BP Location: Right Arm)   Pulse 86   Temp 98.8 F (37.1 C) (Oral)   Resp 18   Ht 5\' 5"  (1.651 m)   Wt 105.7 kg   LMP 06/24/2020   SpO2 98%   BMI 38.77 kg/m   Physical Exam Vitals and nursing note reviewed.  Constitutional:      Appearance: Normal appearance. She is obese.  HENT:     Head: Normocephalic and atraumatic.     Right Ear: External ear normal.     Left Ear: External ear normal.     Mouth/Throat:     Comments: Patient does have bruising on the sides of her tongue  bilaterally.  There is no active bleeding or lacerations. Eyes:     Extraocular Movements: Extraocular movements intact.     Conjunctiva/sclera: Conjunctivae normal.     Pupils: Pupils are equal, round, and reactive to light.  Cardiovascular:     Rate and Rhythm: Normal rate and regular rhythm.     Pulses: Normal pulses.  Pulmonary:     Effort: Pulmonary effort is normal. No respiratory distress.     Breath sounds: Normal breath sounds.  Musculoskeletal:        General: No swelling, tenderness or deformity.     Cervical back: Normal range of motion.  Skin:    General: Skin is warm and dry.  Neurological:     General: No focal deficit present.     Mental Status: She is alert and oriented to person, place, and time.     Cranial Nerves: No cranial nerve deficit.     Coordination: Abnormal coordination:   Psychiatric:        Mood and Affect: Mood normal.        Behavior: Behavior normal.        Thought Content: Thought content normal.     ED Results / Procedures / Treatments   Labs (all labs ordered are listed, but only abnormal results are displayed) Results for orders placed or performed during the hospital encounter of 07/14/20  CBG monitoring, ED  Result Value Ref Range   Glucose-Capillary 92 70 - 99 mg/dL   Laboratory interpretation all normal   EKG None  Radiology No results found.  Procedures Procedures (including critical care time)  Medications Ordered in ED Medications  lamoTRIgine (LAMICTAL) tablet 200 mg (has no administration in time range)    ED Course  I have reviewed the triage vital signs and the nursing notes.  Pertinent labs & imaging results that were available during my care of the patient were reviewed by me and considered in my medical decision making (see chart for details).    MDM Rules/Calculators/A&P                            Patient presents with known seizure disorder who had her typical seizure episode.  She states since she  was  switched from the fast acting to the extended release Keppra her seizures have been occurring less often.  She is also supposed to be on Lamictal which for some reason she has not had the last couple months but when I look in the chart it looks like it was ordered on the 18th.  She was advised to go to the drugstore today to pick it up.  She was given her first dose in the ED tonight.    Final Clinical Impression(s) / ED Diagnoses Final diagnoses:  Seizure disorder [facility])    Rx / DC Orders ED Discharge Orders    None     Plan discharge  Devoria Albe, MD, Concha Pyo, MD 07/14/20 [phone]

## 2020-07-14 NOTE — ED Triage Notes (Signed)
Pt states she had a seizure yesterday and has been sleeping all day and that she thinks she had a "silent one where she stares into space" today.

## 2020-07-17 ENCOUNTER — Telehealth: Payer: Self-pay | Admitting: *Deleted

## 2020-07-17 NOTE — Telephone Encounter (Signed)
Transition Care Management Unsuccessful Follow-up Telephone Call  Date of discharge and from where: [facility], 07/14/20  Attempts:  1st Attempt  Reason for unsuccessful TCM follow-up call:  Left voice message   Burnard Bunting, RN, BSN, CCRN Patient Engagement Center [phone]

## 2020-07-17 NOTE — Telephone Encounter (Signed)
Email sent to [facility]  To schedule patient for emergency dept visit.                                             Jessica Hobbs                                                 PEC                                            [phone]

## 2020-11-07 ENCOUNTER — Ambulatory Visit: Payer: Medicaid Other | Admitting: Neurology

## 2022-04-11 ENCOUNTER — Ambulatory Visit
Admission: EM | Admit: 2022-04-11 | Discharge: 2022-04-11 | Disposition: A | Discharge status: Home / Self Care | Payer: Medicaid Other

## 2022-04-11 DIAGNOSIS — N760 Acute vaginitis: Secondary | ICD-10-CM | POA: Diagnosis not present

## 2022-04-11 LAB — POCT URINALYSIS DIP (MANUAL ENTRY)
Bilirubin, UA: NEGATIVE
Blood, UA: NEGATIVE
Glucose, UA: NEGATIVE mg/dL
Ketones, POC UA: NEGATIVE mg/dL
Leukocytes, UA: NEGATIVE
Nitrite, UA: NEGATIVE
Protein Ur, POC: NEGATIVE mg/dL
Spec Grav, UA: >=1.03 — AB (ref 1.010–1.025)
Urobilinogen, UA: 0.2 E.U./dL
pH, UA: 6 (ref 5.0–8.0)

## 2022-04-11 MED ORDER — FLUCONAZOLE 150 MG PO TABS: 150.0000 mg | ORAL_TABLET | ORAL | 0 refills | Status: DC

## 2022-04-11 MED ORDER — METRONIDAZOLE 500 MG PO TABS: 500.0000 mg | ORAL_TABLET | Freq: Two times a day (BID) | ORAL | 0 refills | Status: AC

## 2022-04-11 NOTE — ED Triage Notes (Signed)
Pt states she started some antibiotics on 05/12 for tonsil issues and now she is having some issues with her vagina  Pt states she is having itchy,burning and discharge  Denies Fever

## 2022-04-11 NOTE — ED Provider Notes (Signed)
[facility]    CSN: [id number] Arrival date & time: 04/11/22  1429      History   Chief Complaint Chief Complaint  Patient presents with   Vaginitis    HPI Jessica Hobbs is a 31 y.o. female.   Presenting today with several day history of vaginal discharge, itching, burning, irritation.  Denies fever, chills, dysuria, nausea, vomiting.  Tried some over-the-counter yeast medication with no relief.  No new sexual partners or concern for STDs.  LMP 03/17/2022.    Past Medical History:  Diagnosis Date   Bipolar 1 disorder (HCC)    Depression    PTSD (post-traumatic stress disorder)    Seizures (HCC)    Sickle cell anemia (HCC)     Patient Active Problem List   Diagnosis Date Noted   Acute cystitis without hematuria 06/08/2020   Acute vaginitis 05/10/2020   Oral thrush 05/10/2020   Seizures (HCC) 01/06/2020   Morbid obesity (HCC) 01/06/2020   Prediabetes 01/06/2020   Bipolar 1 disorder (HCC) 01/06/2020   Fatigue 01/06/2020   PTSD (post-traumatic stress disorder) 01/06/2020   Sickle cell trait (HCC) 01/06/2020    Past Surgical History:  Procedure Laterality Date   CESAREAN SECTION      OB History     Gravida  1   Para      Term      Preterm      AB      Living  1      SAB      IAB      Ectopic      Multiple      Live Births  1            Home Medications    Prior to Admission medications   Medication Sig Start Date End Date Taking? Authorizing Provider  fluconazole (DIFLUCAN) 150 MG tablet Take 1 tablet (150 mg total) by mouth every other day. 04/11/22  Yes Particia NearingLane, Lissa Rowles Elizabeth, PA-C  metroNIDAZOLE (FLAGYL) 500 MG tablet Take 1 tablet (500 mg total) by mouth 2 (two) times daily. 04/11/22  Yes Particia NearingLane, Clayburn Weekly Elizabeth, PA-C  amoxicillin-clavulanate (AUGMENTIN) 875-125 MG tablet SMARTSIG:1 Tablet(s) By Mouth Every 12 Hours 04/05/22   [provider]  Biotin 10 MG CAPS Take 1 capsule by mouth daily.     [provider]  lamoTRIgine (LAMICTAL) 200 MG tablet Take 1 tablet (200 mg total) by mouth 2 (two) times daily. 07/11/20   Van ClinesAquino, Karen M, MD  levETIRAcetam (KEPPRA XR) 500 MG 24 hr tablet Take 4 tablets every night 07/11/20   Van ClinesAquino, Karen M, MD  melatonin ([facility] MELATONIN) 5 MG TABS Take 1 tablet (5 mg total) by mouth at bedtime. 03/27/20   Freddy FinnerMills, Hannah M, NP  vitamin B-12 (CYANOCOBALAMIN) 1000 MCG tablet Take 1,000 mcg by mouth daily.    [provider]    Family History Family History  Problem Relation Age of Onset   Alcohol abuse Father    Drug abuse Father     Social History Social History   Tobacco Use   Smoking status: Never   Smokeless tobacco: Never  Vaping Use   Vaping Use: Never used  Substance Use Topics   Alcohol use: Never   Drug use: Never     Allergies   Soy allergy   Review of Systems Review of Systems Per HPI  Physical Exam Triage Vital Signs ED Triage Vitals [04/11/22 1618]  Enc Vitals Group     BP Marland Kitchen(!)  157/84     Pulse Rate 77     Resp 20     Temp 98.5 F (36.9 C)     Temp Source Oral     SpO2 96 %     Weight      Height      Head Circumference      Peak Flow      Pain Score 5     Pain Loc      Pain Edu?      Excl. in GC?    No data found.  Updated Vital Signs BP (!) 157/84 (BP Location: Right Arm)   Pulse 77   Temp 98.5 F (36.9 C) (Oral)   Resp 20   LMP 03/17/2022 (Approximate)   SpO2 96%   Visual Acuity Right Eye Distance:   Left Eye Distance:   Bilateral Distance:    Right Eye Near:   Left Eye Near:    Bilateral Near:     Physical Exam Vitals and nursing note reviewed.  Constitutional:      Appearance: Normal appearance. She is not ill-appearing.  HENT:     Head: Atraumatic.  Eyes:     Extraocular Movements: Extraocular movements intact.     Conjunctiva/sclera: Conjunctivae normal.  Cardiovascular:     Rate and Rhythm: Normal rate and regular rhythm.     Heart sounds: Normal heart sounds.  Pulmonary:      Effort: Pulmonary effort is normal.     Breath sounds: Normal breath sounds.  Abdominal:     General: Bowel sounds are normal. There is no distension.     Palpations: Abdomen is soft.     Tenderness: There is no abdominal tenderness. There is no right CVA tenderness, left CVA tenderness or guarding.  Genitourinary:    Comments: GU exam deferred, self swab performed Musculoskeletal:        General: Normal range of motion.     Cervical back: Normal range of motion and neck supple.  Skin:    General: Skin is warm and dry.  Neurological:     Mental Status: She is alert and oriented to person, place, and time.  Psychiatric:        Mood and Affect: Mood normal.        Thought Content: Thought content normal.        Judgment: Judgment normal.     UC Treatments / Results  Labs (all labs ordered are listed, but only abnormal results are displayed) Labs Reviewed  POCT URINALYSIS DIP (MANUAL ENTRY) - Abnormal; Notable for the following components:      Result Value   Spec Grav, UA >=1.030 (*)    All other components within normal limits  CERVICOVAGINAL ANCILLARY ONLY    EKG   Radiology No results found.  Procedures Procedures (including critical care time)  Medications Ordered in UC Medications - No data to display  Initial Impression / Assessment and Plan / UC Course  I have reviewed the triage vital signs and the nursing notes.  Pertinent labs & imaging results that were available during my care of the patient were reviewed by me and considered in my medical decision making (see chart for details).     Vitals and exam overall reassuring, will treat for yeast and BV while awaiting swab results.  Discussed supportive care return precautions  Final Clinical Impressions(s) / UC Diagnoses   Final diagnoses:  Acute vaginitis   Discharge Instructions   None    ED  Prescriptions     Medication Sig Dispense Auth. Provider   metroNIDAZOLE (FLAGYL) 500 MG tablet Take  1 tablet (500 mg total) by mouth 2 (two) times daily. 14 tablet Particia Nearing, [place]   fluconazole (DIFLUCAN) 150 MG tablet Take 1 tablet (150 mg total) by mouth every other day. 4 tablet Particia Nearing, [place]      PDMP not reviewed this encounter.   Particia Nearing, [place] 04/11/22 1717

## 2022-04-14 LAB — CERVICOVAGINAL ANCILLARY ONLY
Bacterial Vaginitis (gardnerella): NEGATIVE
Candida Glabrata: NEGATIVE
Candida Vaginitis: POSITIVE — AB
Chlamydia: NEGATIVE
Comment: NEGATIVE
Comment: NEGATIVE
Comment: NEGATIVE
Comment: NEGATIVE
Comment: NEGATIVE
Comment: NORMAL
Neisseria Gonorrhea: NEGATIVE
Trichomonas: NEGATIVE

## 2023-12-12 ENCOUNTER — Ambulatory Visit
Admission: EM | Admit: 2023-12-12 | Discharge: 2023-12-12 | Disposition: A | Discharge status: Home / Self Care | Payer: Medicaid Other | Attending: Nurse Practitioner | Admitting: Nurse Practitioner

## 2023-12-12 DIAGNOSIS — J020 Streptococcal pharyngitis: Secondary | ICD-10-CM

## 2023-12-12 LAB — POCT RAPID STREP A (OFFICE): Rapid Strep A Screen: POSITIVE — AB

## 2023-12-12 MED ORDER — LIDOCAINE VISCOUS HCL 2 % MT SOLN
OROMUCOSAL | 0 refills | Status: AC
Start: 2023-12-12 — End: ?

## 2023-12-12 MED ORDER — AMOXICILLIN 500 MG PO CAPS: 500.0000 mg | ORAL_CAPSULE | Freq: Two times a day (BID) | ORAL | 0 refills | Status: AC

## 2023-12-12 MED ORDER — FLUCONAZOLE 150 MG PO TABS: 150.0000 mg | ORAL_TABLET | Freq: Once | ORAL | 0 refills | Status: AC

## 2023-12-12 NOTE — Discharge Instructions (Addendum)
Take medication as prescribed. Increase fluids and allow for plenty of rest. Recommend over-the-counter Tylenol or Ibuprofen as needed for pain, fever, or general discomfort. Warm salt water gargles 3-4 times daily to help with throat pain or discomfort. Recommend a diet with soft foods to include soups, broths, puddings, yogurt, Jell-O's, or popsicles until symptoms improve. Change toothbrush after 3 days. Follow-up if symptoms do not improve.

## 2023-12-12 NOTE — ED Provider Notes (Signed)
[facility]    CSN: [id number] Arrival date & time: 12/12/23  1308      History   Chief Complaint No chief complaint on file.   HPI Jessica Hobbs is a 33 y.o. female.   The history is provided by the patient.   Patient presents for complaints of sore throat that started over the past 24 hours.  She complains of tonsil swelling and nasal congestion as well.  Denies fever, chills, headache, ear pain, cough, chest pain, abdominal pain, nausea, vomiting, diarrhea, or rash.  States that she took ibuprofen with minimal relief.  Denies any obvious known sick contacts.  Past Medical History:  Diagnosis Date   Bipolar 1 disorder (HCC)    Depression    PTSD (post-traumatic stress disorder)    Seizures (HCC)    Sickle cell anemia (HCC)     Patient Active Problem List   Diagnosis Date Noted   Acute cystitis without hematuria 06/08/2020   Acute vaginitis 05/10/2020   Oral thrush 05/10/2020   Seizures (HCC) 01/06/2020   Morbid obesity (HCC) 01/06/2020   Prediabetes 01/06/2020   Bipolar 1 disorder (HCC) 01/06/2020   Fatigue 01/06/2020   PTSD (post-traumatic stress disorder) 01/06/2020   Sickle cell trait (HCC) 01/06/2020    Past Surgical History:  Procedure Laterality Date   CESAREAN SECTION      OB History     Gravida  1   Para      Term      Preterm      AB      Living  1      SAB      IAB      Ectopic      Multiple      Live Births  1            Home Medications    Prior to Admission medications   Medication Sig Start Date End Date Taking? Authorizing Provider  amoxicillin (AMOXIL) 500 MG capsule Take 1 capsule (500 mg total) by mouth 2 (two) times daily for 10 days. 12/12/23 12/22/23 Yes Leath-Warren, Sadie Haber, NP  fluconazole (DIFLUCAN) 150 MG tablet Take 1 tablet (150 mg total) by mouth once for 1 dose. 12/12/23 12/12/23 Yes Leath-Warren, Sadie Haber, NP  lidocaine (XYLOCAINE) 2 % solution Gargle and spit 5 mL every 6 hours as  needed for throat pain or discomfort. 12/12/23  Yes Leath-Warren, Sadie Haber, NP  amoxicillin-clavulanate (AUGMENTIN) 875-125 MG tablet SMARTSIG:1 Tablet(s) By Mouth Every 12 Hours 04/05/22   [provider]  Biotin 10 MG CAPS Take 1 capsule by mouth daily.     [provider]  lamoTRIgine (LAMICTAL) 200 MG tablet Take 1 tablet (200 mg total) by mouth 2 (two) times daily. 07/11/20   Van Clines, MD  levETIRAcetam (KEPPRA XR) 500 MG 24 hr tablet Take 4 tablets every night 07/11/20   Van Clines, MD  melatonin ([facility] MELATONIN) 5 MG TABS Take 1 tablet (5 mg total) by mouth at bedtime. 03/27/20   Freddy Finner, NP  metroNIDAZOLE (FLAGYL) 500 MG tablet Take 1 tablet (500 mg total) by mouth 2 (two) times daily. 04/11/22   Particia Nearing, PA-C  vitamin B-12 (CYANOCOBALAMIN) 1000 MCG tablet Take 1,000 mcg by mouth daily.    [provider]    Family History Family History  Problem Relation Age of Onset   Alcohol abuse Father    Drug abuse Father     Social History Social  History   Tobacco Use   Smoking status: Never   Smokeless tobacco: Never  Vaping Use   Vaping status: Never Used  Substance Use Topics   Alcohol use: Never   Drug use: Never     Allergies   Soy allergy (do not select)   Review of Systems Review of Systems Per HPI  Physical Exam Triage Vital Signs ED Triage Vitals  Encounter Vitals Group     BP 12/12/23 1313 (!) 147/102     Systolic BP Percentile --      Diastolic BP Percentile --      Pulse Rate 12/12/23 1313 (!) 113     Resp 12/12/23 1313 20     Temp 12/12/23 1313 98.8 F (37.1 C)     Temp Source 12/12/23 1311 Oral     SpO2 12/12/23 1313 94 %     Weight --      Height --      Head Circumference --      Peak Flow --      Pain Score 12/12/23 1315 10     Pain Loc --      Pain Education --      Exclude from Growth Chart --    No data found.  Updated Vital Signs BP (!) 147/102 (BP Location: Right Arm)   Pulse  (!) 113   Temp 98.8 F (37.1 C) (Oral)   Resp 20   LMP 11/30/2023 (Exact Date)   SpO2 94%   Visual Acuity Right Eye Distance:   Left Eye Distance:   Bilateral Distance:    Right Eye Near:   Left Eye Near:    Bilateral Near:     Physical Exam Vitals and nursing note reviewed.  Constitutional:      General: She is not in acute distress.    Appearance: Normal appearance.  HENT:     Head: Normocephalic.     Right Ear: Tympanic membrane, ear canal and external ear normal.     Left Ear: Tympanic membrane, ear canal and external ear normal.     Nose: Congestion present.     Right Turbinates: Enlarged and swollen.     Left Turbinates: Enlarged and swollen.     Right Sinus: No maxillary sinus tenderness or frontal sinus tenderness.     Left Sinus: No maxillary sinus tenderness or frontal sinus tenderness.     Mouth/Throat:     Lips: Pink.     Mouth: Mucous membranes are moist.     Pharynx: Pharyngeal swelling, posterior oropharyngeal erythema and postnasal drip present. No oropharyngeal exudate.     Tonsils: 2+ on the right. 2+ on the left.  Eyes:     Extraocular Movements: Extraocular movements intact.     Conjunctiva/sclera: Conjunctivae normal.     Pupils: Pupils are equal, round, and reactive to light.  Cardiovascular:     Rate and Rhythm: Regular rhythm. Tachycardia present.     Pulses: Normal pulses.     Heart sounds: Normal heart sounds.  Pulmonary:     Effort: Pulmonary effort is normal. No respiratory distress.     Breath sounds: Normal breath sounds. No stridor. No wheezing, rhonchi or rales.  Abdominal:     General: Bowel sounds are normal.     Palpations: Abdomen is soft.     Tenderness: There is no abdominal tenderness.  Musculoskeletal:     Cervical back: Normal range of motion.  Lymphadenopathy:     Cervical: Cervical adenopathy present.  Skin:    General: Skin is warm and dry.  Neurological:     General: No focal deficit present.     Mental Status: She  is alert and oriented to person, place, and time.  Psychiatric:        Mood and Affect: Mood normal.        Behavior: Behavior normal.      UC Treatments / Results  Labs (all labs ordered are listed, but only abnormal results are displayed) Labs Reviewed  POCT RAPID STREP A (OFFICE) - Abnormal; Notable for the following components:      Result Value   Rapid Strep A Screen Positive (*)    All other components within normal limits    EKG   Radiology No results found.  Procedures Procedures (including critical care time)  Medications Ordered in UC Medications - No data to display  Initial Impression / Assessment and Plan / UC Course  I have reviewed the triage vital signs and the nursing notes.  Pertinent labs & imaging results that were available during my care of the patient were reviewed by me and considered in my medical decision making (see chart for details).  Rapid strep test was positive, will treat with amoxicillin 500 mg twice daily x 10 days.  Viscous lidocaine 2% also provided for patient to gargle and spit for throat pain or discomfort.  Patient was provided Diflucan for yeast infection caused by antibiotic.  Supportive care recommendations were provided and discussed with the patient to include fluids, rest, continuing over-the-counter analgesics, and a soft diet.  Patient advised to discard her toothbrush after 3 days.  Patient was in agreement with this plan of care and verbalized understanding.  All questions were answered.  Patient stable for discharge.  Final Clinical Impressions(s) / UC Diagnoses   Final diagnoses:  Streptococcal sore throat     Discharge Instructions      Take medication as prescribed. Increase fluids and allow for plenty of rest. Recommend over-the-counter Tylenol or Ibuprofen as needed for pain, fever, or general discomfort. Warm salt water gargles 3-4 times daily to help with throat pain or discomfort. Recommend a diet with soft  foods to include soups, broths, puddings, yogurt, Jell-O's, or popsicles until symptoms improve. Change toothbrush after 3 days. Follow-up if symptoms do not improve.     ED Prescriptions     Medication Sig Dispense Auth. Provider   amoxicillin (AMOXIL) 500 MG capsule Take 1 capsule (500 mg total) by mouth 2 (two) times daily for 10 days. 20 capsule Leath-Warren, Sadie Haber, NP   lidocaine (XYLOCAINE) 2 % solution Gargle and spit 5 mL every 6 hours as needed for throat pain or discomfort. 100 mL Leath-Warren, Sadie Haber, NP   fluconazole (DIFLUCAN) 150 MG tablet Take 1 tablet (150 mg total) by mouth once for 1 dose. 1 tablet Leath-Warren, Sadie Haber, NP      PDMP not reviewed this encounter.   Abran Cantor, NP 12/12/23 1347

## 2023-12-12 NOTE — ED Triage Notes (Signed)
Pt reports her tonsils are swollen, throat pain, and nasal congestion x 1 day   Took ibuprofen but only slight relief
